# Patient Record
Sex: Male | Born: 1950
Health system: Southern US, Community
[De-identification: ages and names within clinical notes are randomized; demographics above are authoritative.]

## PROBLEM LIST (undated history)

## (undated) DIAGNOSIS — E119 Type 2 diabetes mellitus without complications: Secondary | ICD-10-CM

## (undated) DIAGNOSIS — I10 Essential (primary) hypertension: Secondary | ICD-10-CM

## (undated) DIAGNOSIS — E785 Hyperlipidemia, unspecified: Secondary | ICD-10-CM

## (undated) HISTORY — DX: Hyperlipidemia, unspecified: E78.5

---

## 2005-07-29 ENCOUNTER — Emergency Department (HOSPITAL_COMMUNITY): Admission: EM | Admit: 2005-07-29 | Discharge: 2005-07-29 | Payer: Self-pay | Admitting: Emergency Medicine

## 2009-03-18 ENCOUNTER — Encounter (INDEPENDENT_AMBULATORY_CARE_PROVIDER_SITE_OTHER): Payer: Self-pay | Admitting: *Deleted

## 2010-04-13 ENCOUNTER — Emergency Department (HOSPITAL_COMMUNITY): Admission: EM | Admit: 2010-04-13 | Discharge: 2010-04-13 | Payer: Self-pay | Admitting: Emergency Medicine

## 2010-04-27 ENCOUNTER — Ambulatory Visit: Payer: Self-pay | Admitting: Cardiology

## 2010-04-27 ENCOUNTER — Inpatient Hospital Stay (HOSPITAL_COMMUNITY): Admission: EM | Admit: 2010-04-27 | Discharge: 2010-04-28 | Payer: Self-pay | Admitting: Emergency Medicine

## 2010-11-27 LAB — CBC
HCT: 36.5 % — ABNORMAL LOW (ref 39.0–52.0)
HCT: 38.6 % — ABNORMAL LOW (ref 39.0–52.0)
Hemoglobin: 13 g/dL (ref 13.0–17.0)
Hemoglobin: 13.3 g/dL (ref 13.0–17.0)
Hemoglobin: 14.6 g/dL (ref 13.0–17.0)
MCH: 30.4 pg (ref 26.0–34.0)
MCH: 30.8 pg (ref 26.0–34.0)
MCH: 31.4 pg (ref 26.0–34.0)
MCHC: 34.5 g/dL (ref 30.0–36.0)
MCHC: 34.5 g/dL (ref 30.0–36.0)
MCHC: 35.5 g/dL (ref 30.0–36.0)
MCV: 88.6 fL (ref 78.0–100.0)
MCV: 89.1 fL (ref 78.0–100.0)
Platelets: 217 10*3/uL (ref 150–400)
Platelets: 240 10*3/uL (ref 150–400)
Platelets: 261 10*3/uL (ref 150–400)
RBC: 4.12 MIL/uL — ABNORMAL LOW (ref 4.22–5.81)
RBC: 4.34 MIL/uL (ref 4.22–5.81)
RBC: 4.79 MIL/uL (ref 4.22–5.81)
RDW: 12.8 % (ref 11.5–15.5)
RDW: 13.3 % (ref 11.5–15.5)
WBC: 5.9 10*3/uL (ref 4.0–10.5)
WBC: 6.5 10*3/uL (ref 4.0–10.5)

## 2010-11-27 LAB — CARDIAC PANEL(CRET KIN+CKTOT+MB+TROPI)
CK, MB: 2 ng/mL (ref 0.3–4.0)
CK, MB: 2.1 ng/mL (ref 0.3–4.0)
CK, MB: 2.2 ng/mL (ref 0.3–4.0)
CK, MB: 2.4 ng/mL (ref 0.3–4.0)
Relative Index: INVALID (ref 0.0–2.5)
Relative Index: INVALID (ref 0.0–2.5)
Relative Index: INVALID (ref 0.0–2.5)
Total CK: 56 U/L (ref 7–232)
Total CK: 58 U/L (ref 7–232)
Total CK: 63 U/L (ref 7–232)
Total CK: 64 U/L (ref 7–232)
Troponin I: 0.01 ng/mL (ref 0.00–0.06)
Troponin I: 0.01 ng/mL (ref 0.00–0.06)
Troponin I: 0.02 ng/mL (ref 0.00–0.06)

## 2010-11-27 LAB — BASIC METABOLIC PANEL
BUN: 23 mg/dL (ref 6–23)
CO2: 24 mEq/L (ref 19–32)
CO2: 25 mEq/L (ref 19–32)
Calcium: 8.7 mg/dL (ref 8.4–10.5)
Calcium: 8.9 mg/dL (ref 8.4–10.5)
Chloride: 103 mEq/L (ref 96–112)
Creatinine, Ser: 1.43 mg/dL (ref 0.4–1.5)
Creatinine, Ser: 1.44 mg/dL (ref 0.4–1.5)
GFR calc Af Amer: >60 mL/min (ref >60)
GFR calc Af Amer: >60 mL/min (ref >60)
GFR calc non Af Amer: 51 mL/min — ABNORMAL LOW (ref >60)
GFR calc non Af Amer: 51 mL/min — ABNORMAL LOW (ref >60)
Glucose, Bld: 244 mg/dL — ABNORMAL HIGH (ref 70–99)
Glucose, Bld: 487 mg/dL — ABNORMAL HIGH (ref 70–99)
Potassium: 4.6 mEq/L (ref 3.5–5.1)
Sodium: 127 mEq/L — ABNORMAL LOW (ref 135–145)
Sodium: 133 mEq/L — ABNORMAL LOW (ref 135–145)

## 2010-11-27 LAB — DIFFERENTIAL
Basophils Absolute: 0 10*3/uL (ref 0.0–0.1)
Basophils Relative: 0 % (ref 0–1)
Basophils Relative: 0 % (ref 0–1)
Eosinophils Absolute: 0.1 10*3/uL (ref 0.0–0.7)
Eosinophils Absolute: 0.1 10*3/uL (ref 0.0–0.7)
Eosinophils Absolute: 0.2 10*3/uL (ref 0.0–0.7)
Eosinophils Relative: 2 % (ref 0–5)
Eosinophils Relative: 3 % (ref 0–5)
Lymphocytes Relative: 29 % (ref 12–46)
Lymphocytes Relative: 43 % (ref 12–46)
Lymphs Abs: 1.9 10*3/uL (ref 0.7–4.0)
Lymphs Abs: 2.5 10*3/uL (ref 0.7–4.0)
Monocytes Absolute: 0.6 10*3/uL (ref 0.1–1.0)
Monocytes Relative: 8 % (ref 3–12)
Monocytes Relative: 9 % (ref 3–12)
Neutro Abs: 3.9 10*3/uL (ref 1.7–7.7)
Neutro Abs: 3.9 10*3/uL (ref 1.7–7.7)
Neutrophils Relative %: 60 % (ref 43–77)
Neutrophils Relative %: 63 % (ref 43–77)

## 2010-11-27 LAB — CK: Total CK: 65 U/L (ref 7–232)

## 2010-11-27 LAB — URINALYSIS, ROUTINE W REFLEX MICROSCOPIC
Leukocytes, UA: NEGATIVE
Nitrite: NEGATIVE
Protein, ur: NEGATIVE mg/dL
Specific Gravity, Urine: 1.015 (ref 1.005–1.030)
Urobilinogen, UA: 0.2 mg/dL (ref 0.0–1.0)

## 2010-11-27 LAB — GLUCOSE, CAPILLARY
Glucose-Capillary: 176 mg/dL — ABNORMAL HIGH (ref 70–99)
Glucose-Capillary: 196 mg/dL — ABNORMAL HIGH (ref 70–99)
Glucose-Capillary: 210 mg/dL — ABNORMAL HIGH (ref 70–99)
Glucose-Capillary: 242 mg/dL — ABNORMAL HIGH (ref 70–99)
Glucose-Capillary: 249 mg/dL — ABNORMAL HIGH (ref 70–99)
Glucose-Capillary: 260 mg/dL — ABNORMAL HIGH (ref 70–99)

## 2010-11-27 LAB — COMPREHENSIVE METABOLIC PANEL
ALT: 16 U/L (ref 0–53)
AST: 14 U/L (ref 0–37)
Albumin: 3.7 g/dL (ref 3.5–5.2)
Alkaline Phosphatase: 44 U/L (ref 39–117)
BUN: 12 mg/dL (ref 6–23)
CO2: 25 mEq/L (ref 19–32)
Calcium: 8.9 mg/dL (ref 8.4–10.5)
Chloride: 107 mEq/L (ref 96–112)
Creatinine, Ser: 1.01 mg/dL (ref 0.4–1.5)
GFR calc Af Amer: >60 mL/min (ref >60)
GFR calc non Af Amer: >60 mL/min (ref >60)
Glucose, Bld: 175 mg/dL — ABNORMAL HIGH (ref 70–99)
Potassium: 3.7 mEq/L (ref 3.5–5.1)
Sodium: 139 mEq/L (ref 135–145)
Total Bilirubin: 0.6 mg/dL (ref 0.3–1.2)
Total Protein: 6.9 g/dL (ref 6.0–8.3)

## 2010-11-27 LAB — URINE MICROSCOPIC-ADD ON

## 2010-11-27 LAB — POCT CARDIAC MARKERS
CKMB, poc: 1.2 ng/mL (ref 1.0–8.0)
CKMB, poc: 1.4 ng/mL (ref 1.0–8.0)
Myoglobin, poc: 85.3 ng/mL (ref 12–200)
Myoglobin, poc: 90.6 ng/mL (ref 12–200)
Troponin i, poc: <0.05 ng/mL (ref 0.00–0.09)
Troponin i, poc: <0.05 ng/mL (ref 0.00–0.09)

## 2010-11-27 LAB — HEMOGLOBIN A1C
Hgb A1c MFr Bld: 12.5 % — ABNORMAL HIGH (ref <5.7)
Mean Plasma Glucose: 312 mg/dL — ABNORMAL HIGH (ref <117)

## 2010-11-27 LAB — TSH: TSH: 0.957 u[IU]/mL (ref 0.350–4.500)

## 2010-11-27 LAB — T4, FREE: Free T4: 1.23 ng/dL (ref 0.80–1.80)

## 2010-11-27 LAB — TROPONIN I: Troponin I: 0.02 ng/mL (ref 0.00–0.06)

## 2010-11-27 LAB — D-DIMER, QUANTITATIVE: D-Dimer, Quant: 0.47 ug/mL-FEU (ref 0.00–0.48)

## 2011-07-12 IMAGING — US US CAROTID DUPLEX BILAT
1 series · 13 of 24 positions shown · non-contrast
Comparison: None

CLINICAL DATA: Near-syncope, hypertension, smoking history

BILATERAL CAROTID DUPLEX ULTRASOUND
TECHNIQUE: Gray scale imaging, color Doppler and duplex ultrasound
was performed of bilateral carotid and vertebral arteries in the
neck.

[Series 1: us carotid duplex bilat · 0.07mm/px · 13 of 67 slices shown]
[im 1/67]
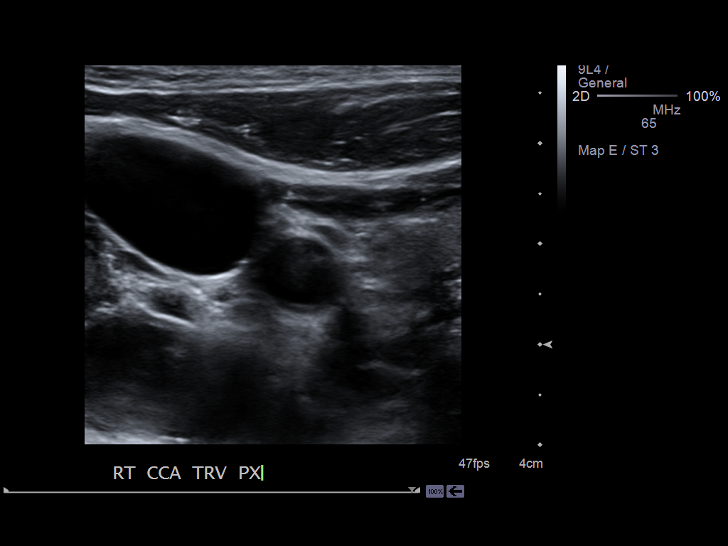
[im 6/67]
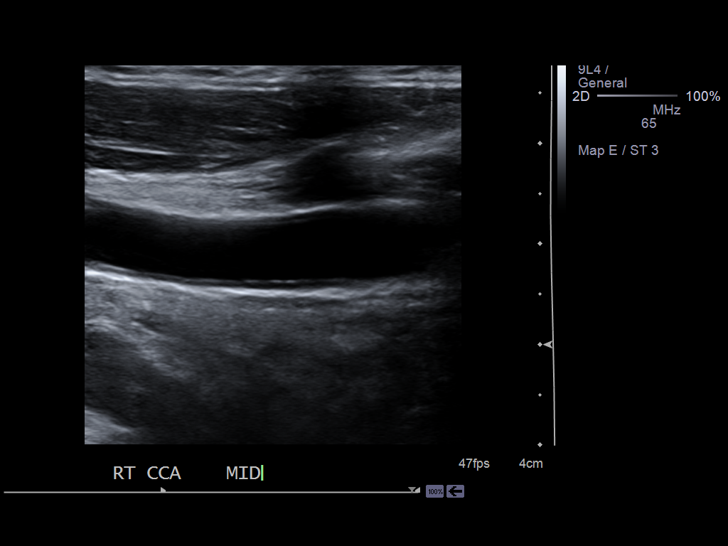
[im 12/67]
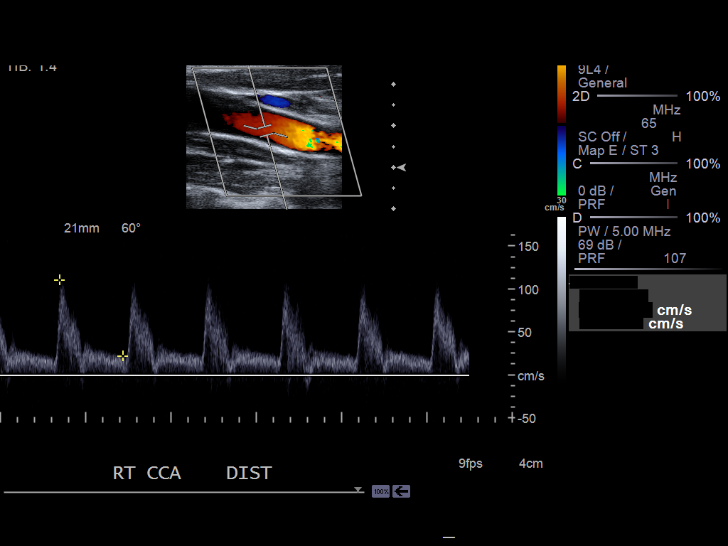
[im 18/67]
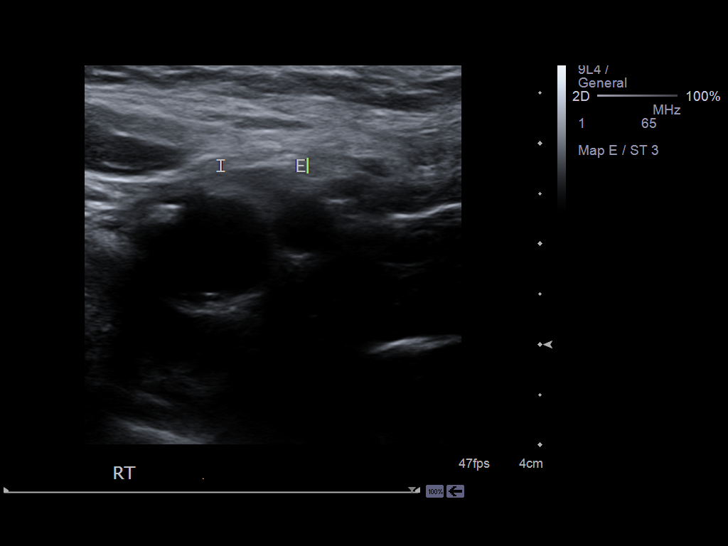
[im 23/67]
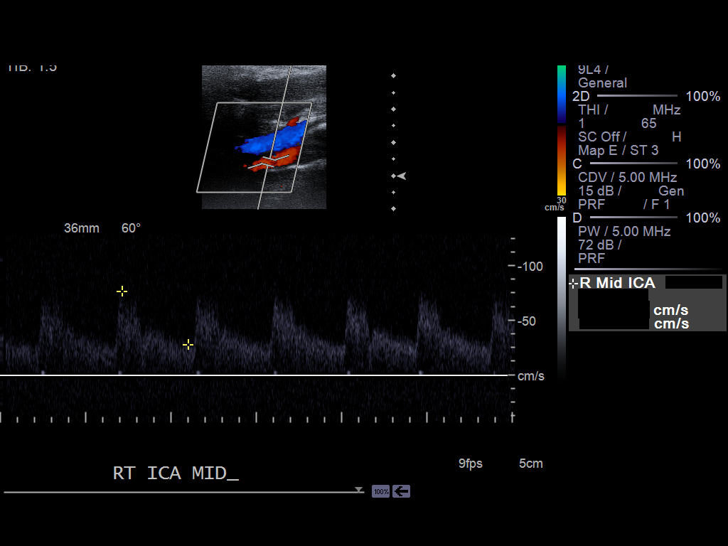
[im 29/67]
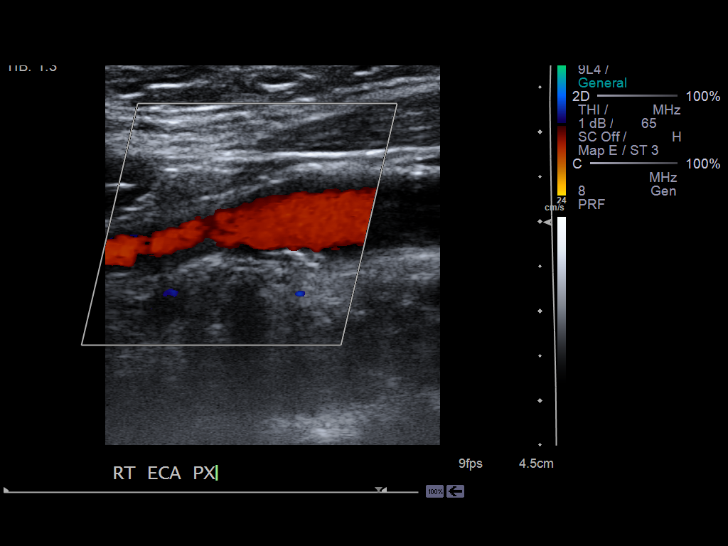
[im 35/67]
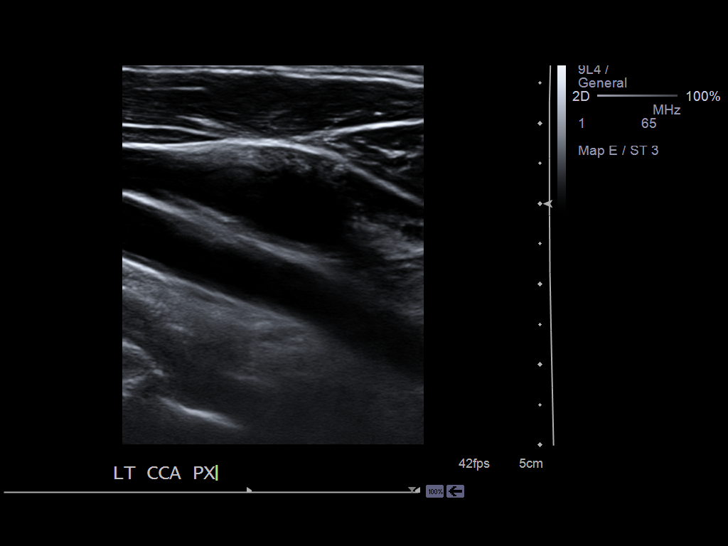
[im 38/67]
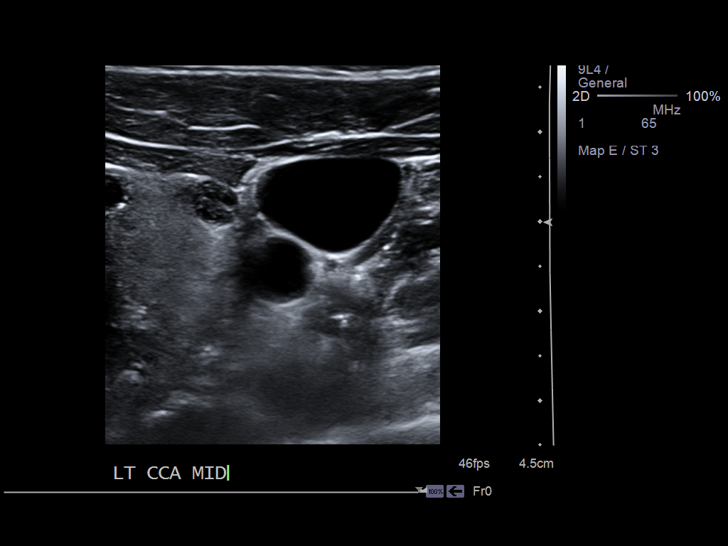
[im 44/67]
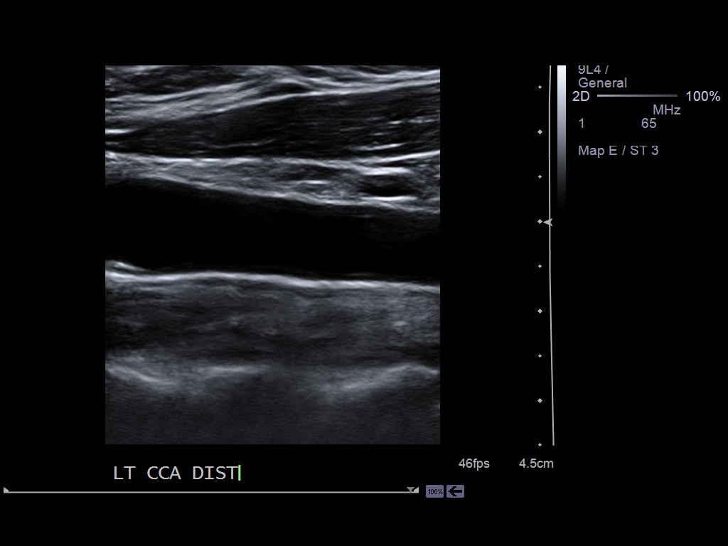
[im 49/67]
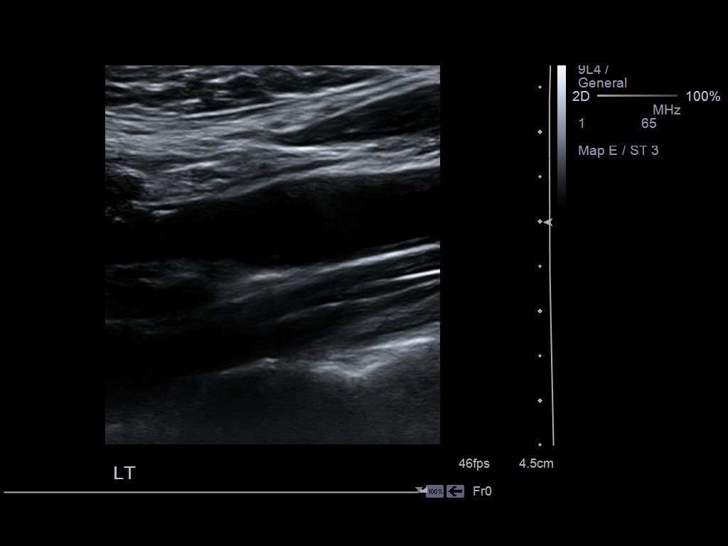
[im 55/67]
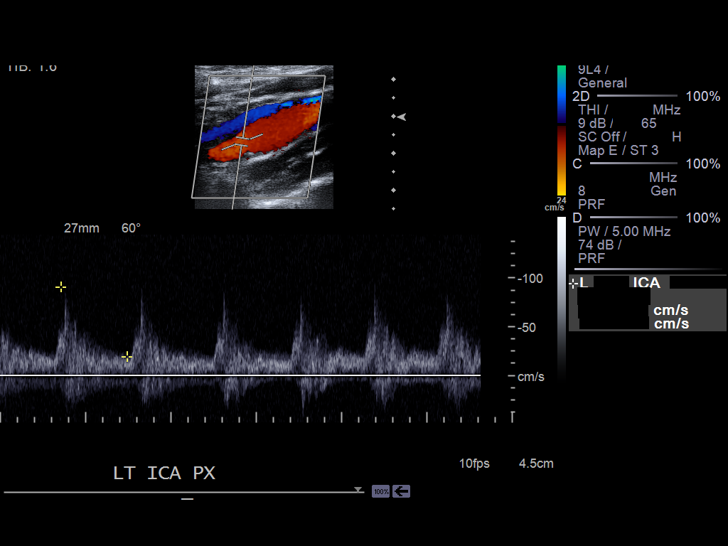
[im 61/67]
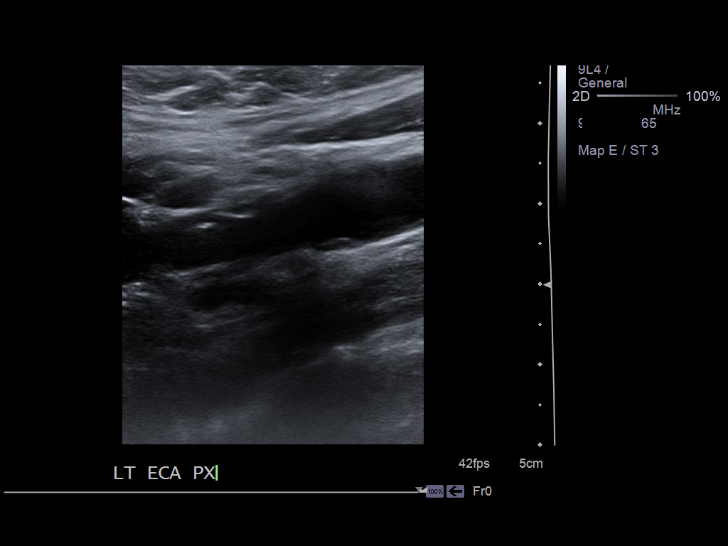
[im 67/67]
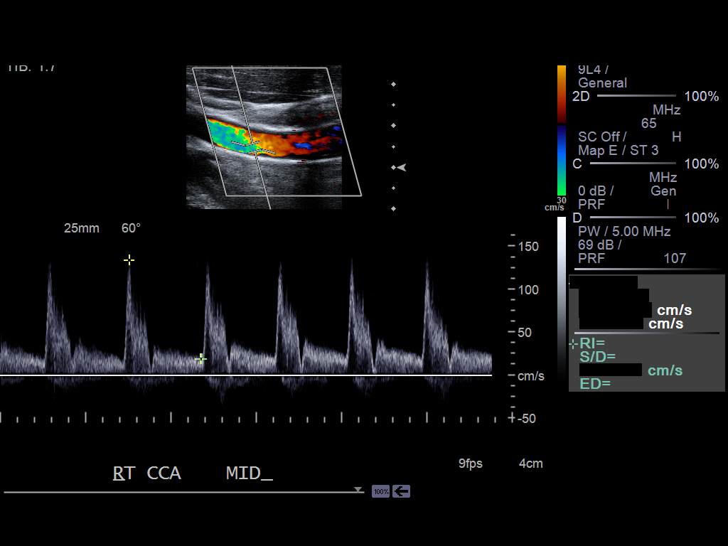

[13 of 24 positions shown; findings below may reference images not displayed]

Criteria:  Quantification of carotid stenosis is based on velocity
parameters that correlate the residual internal carotid diameter
with NASCET-based stenosis levels, using the diameter of the distal
internal carotid lumen as the denominator for stenosis measurement.

The following velocity measurements were obtained:

                 PEAK SYSTOLIC/END DIASTOLIC
RIGHT
ICA:                        79/22cm/sec
CCA:                        135/20cm/sec
SYSTOLIC ICA/CCA RATIO:
DIASTOLIC ICA/CCA RATIO:
ECA:                        121/13cm/sec

LEFT
ICA:                        91/20cm/sec
CCA:                        175/26cm/sec
SYSTOLIC ICA/CCA RATIO:
DIASTOLIC ICA/CCA RATIO:
ECA:                        135/16cm/sec
FINDINGS: RIGHT CAROTID ARTERY: Mild intimal thickening right CCA.  Minimally
turbulent flow at areas of mild tortuosity.  Minimal plaque
formation right carotid bulb with associated turbulent flow.
Additional plaque formation proximal right ECA.  Spectral
broadening proximal right ICA on waveform analysis likely related
to turbulence at carotid bulb.  No high velocity jets.

RIGHT VERTEBRAL ARTERY:  Patent, antegrade

LEFT CAROTID ARTERY: Intimal thickening left CCA.  Tiny echogenic
plaque left carotid bulb.  Tiny noncalcified plaque proximal left
ICA. Turbulent flow proximal left ICA, presumed due to tortuosity
since the no significant plaque is seen.  Minimal plaque also noted
the proximal left ECA.  Spectral broadening left ICA on waveform
analysis.  No high velocity jets.

LEFT VERTEBRAL ARTERY:  Patent, antegrade
IMPRESSION: Minimal plaque at carotid bifurcations.  No evidence of
hemodynamically significant stenosis within the visualized common
carotid or internal carotid arteries.

Elevated peak systolic velocities are noted in the common carotid
arteries bilaterally.  This can be seen with hyperdynamic states
and with proximal stenoses at the origins of the common carotid
arteries or at aortic arch level.  Consider MRA imaging of neck,
which includes assessment to the aortic arch, in order to exclude
proximal common carotid arterial stenoses.

## 2014-07-23 ENCOUNTER — Encounter: Payer: Self-pay | Admitting: Orthopedic Surgery

## 2014-07-23 ENCOUNTER — Ambulatory Visit: Payer: Self-pay | Admitting: Orthopedic Surgery

## 2015-04-30 ENCOUNTER — Ambulatory Visit (HOSPITAL_COMMUNITY)
Admission: RE | Admit: 2015-04-30 | Discharge: 2015-04-30 | Disposition: A | Discharge status: Home / Self Care | Payer: BLUE CROSS/BLUE SHIELD | Source: Ambulatory Visit | Attending: Family Medicine | Admitting: Family Medicine

## 2015-04-30 ENCOUNTER — Other Ambulatory Visit (HOSPITAL_COMMUNITY): Payer: Self-pay | Admitting: Family Medicine

## 2015-04-30 DIAGNOSIS — M25561 Pain in right knee: Secondary | ICD-10-CM

## 2015-06-03 ENCOUNTER — Ambulatory Visit: Payer: BLUE CROSS/BLUE SHIELD | Admitting: Orthopedic Surgery

## 2015-06-03 ENCOUNTER — Other Ambulatory Visit: Payer: Self-pay | Admitting: Orthopedic Surgery

## 2015-06-03 DIAGNOSIS — M25561 Pain in right knee: Secondary | ICD-10-CM

## 2015-06-14 ENCOUNTER — Other Ambulatory Visit: Payer: BLUE CROSS/BLUE SHIELD

## 2015-06-28 ENCOUNTER — Other Ambulatory Visit: Payer: BLUE CROSS/BLUE SHIELD

## 2015-12-31 ENCOUNTER — Ambulatory Visit (INDEPENDENT_AMBULATORY_CARE_PROVIDER_SITE_OTHER): Payer: BLUE CROSS/BLUE SHIELD | Admitting: Orthopedic Surgery

## 2015-12-31 ENCOUNTER — Encounter: Payer: Self-pay | Admitting: Orthopedic Surgery

## 2015-12-31 VITALS — BP 176/69 | Ht 74.0 in | Wt 227.0 lb

## 2015-12-31 DIAGNOSIS — S83241A Other tear of medial meniscus, current injury, right knee, initial encounter: Secondary | ICD-10-CM | POA: Diagnosis not present

## 2015-12-31 DIAGNOSIS — M25461 Effusion, right knee: Secondary | ICD-10-CM | POA: Diagnosis not present

## 2015-12-31 NOTE — Patient Instructions (Signed)
Ice 3 x a day   Take ibuprofen 2 x a day

## 2016-01-01 NOTE — Progress Notes (Signed)
Patient ID: Norman Campbell, male   DOB: 1951/04/29, 65 y.o.   MRN: [id number]  No chief complaint on file.   HPI Norman Campbell is a 65 y.o. male. Presents for evaluation of his right knee pain 1 year  Again pain right knee Swelling Aching No trauma Worse with standing improved with heat 6 out of 10 pain intensity Prior x-ray was done.   Review of Systems Review of systems diet review of systems swollen joints and joint pain from musculoskeletal constitutional denied weight loss fever chills neuro denied numbness or tingling    The patient reports he has diabetes and hypertension  The patient reports that he has not had any surgery  Social History Social History  Substance Use Topics  . Smoking status: Current Every Day Smoker  . Smokeless tobacco: None  . Alcohol Use: None    No Known Allergies  Current Outpatient Prescriptions  Medication Sig Dispense Refill  . aspirin 81 MG tablet 81 mg.    . lisinopril (PRINIVIL,ZESTRIL) 20 MG tablet 20 mg.    . meclizine (ANTIVERT) 25 MG tablet 25 mg.    . metFORMIN (GLUCOPHAGE) 1000 MG tablet 1,000 mg.     No current facility-administered medications for this visit.    Physical Exam  BP 176/69 mmHg  Ht 6\' 2"  (1.88 m)  Wt 227 lb (102.967 kg)  BMI 29.13 kg/m2  Physical Exam  Constitutional: He is oriented to person, place, and time. He appears well-developed and well-nourished. No distress.  Cardiovascular: Normal rate and intact distal pulses.   Neurological: He is alert and oriented to person, place, and time.  Skin: Skin is warm and dry. No rash noted. He is not diaphoretic. No erythema. No pallor.  Psychiatric: He has a normal mood and affect. His behavior is normal. Judgment and thought content normal.    Ortho Exam  Gait: Slight limp  Right knee medial joint line tenderness right knee large effusion right knee ligament stable strength normal right knee limited range of motion 90  Left knee full range  of motion normal stability and strength skin normal no tenderness or swelling  Data Reviewed I think his x-ray shows very good looking knee for his age and the amount of swelling and limited motion  Assessment  Joint effusion probable meniscal tear   Plan  Aspirate inject knee continue ibuprofen follow-up in 2-3 weeks  Procedure note injection and aspiration right knee joint  Verbal consent was obtained to aspirate and inject the right knee joint   Timeout was completed to confirm the site of aspiration and injection  An 18-gauge needle was used to aspirate the knee joint from a suprapatellar lateral approach.  The medications used were 40 mg of Depo-Medrol and 1% lidocaine 3 cc  Anesthesia was provided by ethyl chloride and the skin was prepped with alcohol.  After cleaning the skin with alcohol an 18-gauge needle was used to aspirate the right knee joint.  We obtained 40  cc of fluid  We follow this by injection of 40 mg of Depo-Medrol and 3 cc 1% lidocaine.  There were no complications. A sterile bandage was applied.

## 2016-01-14 ENCOUNTER — Ambulatory Visit: Payer: BLUE CROSS/BLUE SHIELD | Admitting: Orthopedic Surgery

## 2016-01-21 ENCOUNTER — Ambulatory Visit: Payer: BLUE CROSS/BLUE SHIELD | Admitting: Orthopedic Surgery

## 2016-02-11 ENCOUNTER — Ambulatory Visit (HOSPITAL_COMMUNITY)
Admission: RE | Admit: 2016-02-11 | Discharge: 2016-02-11 | Disposition: A | Discharge status: Home / Self Care | Payer: BLUE CROSS/BLUE SHIELD | Source: Ambulatory Visit | Attending: Family Medicine | Admitting: Family Medicine

## 2016-02-11 DIAGNOSIS — R002 Palpitations: Secondary | ICD-10-CM | POA: Diagnosis not present

## 2016-02-11 DIAGNOSIS — R42 Dizziness and giddiness: Secondary | ICD-10-CM | POA: Diagnosis not present

## 2019-09-26 ENCOUNTER — Ambulatory Visit: Payer: Self-pay | Attending: Internal Medicine

## 2019-09-26 ENCOUNTER — Other Ambulatory Visit: Payer: Self-pay

## 2019-09-26 DIAGNOSIS — Z20822 Contact with and (suspected) exposure to covid-19: Secondary | ICD-10-CM | POA: Insufficient documentation

## 2019-09-28 ENCOUNTER — Telehealth: Payer: Self-pay

## 2019-09-28 LAB — NOVEL CORONAVIRUS, NAA: SARS-CoV-2, NAA: NOT DETECTED

## 2019-09-28 NOTE — Telephone Encounter (Signed)
Pt notified of negative COVID-19 results. Understanding verbalized.  Norman Campbell   

## 2019-10-03 ENCOUNTER — Other Ambulatory Visit: Payer: Self-pay

## 2019-10-03 ENCOUNTER — Ambulatory Visit: Payer: Self-pay | Attending: Internal Medicine

## 2019-10-03 DIAGNOSIS — Z20822 Contact with and (suspected) exposure to covid-19: Secondary | ICD-10-CM | POA: Insufficient documentation

## 2019-10-04 LAB — NOVEL CORONAVIRUS, NAA: SARS-CoV-2, NAA: NOT DETECTED

## 2019-11-13 ENCOUNTER — Ambulatory Visit: Payer: Self-pay | Attending: Internal Medicine

## 2019-11-13 DIAGNOSIS — Z23 Encounter for immunization: Secondary | ICD-10-CM | POA: Insufficient documentation

## 2019-11-13 NOTE — Progress Notes (Signed)
   Covid-19 Vaccination Clinic  Name:  Norman Campbell    MRN: [id number] DOB: 06-02-1951  11/13/2019  Norman Campbell was observed post Covid-19 immunization for 15 minutes without incident. He was provided with Vaccine Information Sheet and instruction to access the V-Safe system.   Norman Campbell was instructed to call 911 with any severe reactions post vaccine: Marland Kitchen Difficulty breathing  . Swelling of face and throat  . A fast heartbeat  . A bad rash all over body  . Dizziness and weakness   Immunizations Administered    Name Date Dose VIS Date Route   Moderna COVID-19 Vaccine 11/13/2019  1:25 PM 0.5 mL 08/14/2019 Intramuscular   Manufacturer: Moderna   Lot: [id number]   NDC: 74944-967-59

## 2019-12-11 ENCOUNTER — Ambulatory Visit: Payer: Self-pay | Attending: Internal Medicine

## 2019-12-11 DIAGNOSIS — Z23 Encounter for immunization: Secondary | ICD-10-CM

## 2019-12-11 NOTE — Progress Notes (Signed)
   Covid-19 Vaccination Clinic  Name:  Norman Campbell    MRN: [id number] DOB: [birth year]  12/11/2019  [name] was observed post Covid-19 immunization for 15 minutes without incident. He was provided with Vaccine Information Sheet and instruction to access the V-Safe system.   [name] was instructed to call 911 with any severe reactions post vaccine: Marland Kitchen Difficulty breathing  . Swelling of face and throat  . A fast heartbeat  . A bad rash all over body  . Dizziness and weakness   Immunizations Administered    Name Date Dose VIS Date Route   Moderna COVID-19 Vaccine 12/11/2019 12:45 PM 0.5 mL 08/14/2019 Intramuscular   Manufacturer: Moderna   Lot: [id number]   NDC: 01007-121-97

## 2020-11-03 ENCOUNTER — Encounter (HOSPITAL_COMMUNITY): Payer: Self-pay

## 2020-11-03 ENCOUNTER — Emergency Department (HOSPITAL_COMMUNITY): Payer: Medicare HMO

## 2020-11-03 ENCOUNTER — Other Ambulatory Visit: Payer: Self-pay

## 2020-11-03 ENCOUNTER — Emergency Department (HOSPITAL_COMMUNITY)
Admission: EM | Admit: 2020-11-03 | Discharge: 2020-11-03 | Disposition: A | Discharge status: Home / Self Care | Payer: Medicare HMO | Attending: Emergency Medicine | Admitting: Emergency Medicine

## 2020-11-03 DIAGNOSIS — Z7982 Long term (current) use of aspirin: Secondary | ICD-10-CM | POA: Diagnosis not present

## 2020-11-03 DIAGNOSIS — I10 Essential (primary) hypertension: Secondary | ICD-10-CM | POA: Insufficient documentation

## 2020-11-03 DIAGNOSIS — Z79899 Other long term (current) drug therapy: Secondary | ICD-10-CM | POA: Insufficient documentation

## 2020-11-03 DIAGNOSIS — F17210 Nicotine dependence, cigarettes, uncomplicated: Secondary | ICD-10-CM | POA: Insufficient documentation

## 2020-11-03 DIAGNOSIS — Z7984 Long term (current) use of oral hypoglycemic drugs: Secondary | ICD-10-CM | POA: Insufficient documentation

## 2020-11-03 DIAGNOSIS — E119 Type 2 diabetes mellitus without complications: Secondary | ICD-10-CM | POA: Insufficient documentation

## 2020-11-03 DIAGNOSIS — M25531 Pain in right wrist: Secondary | ICD-10-CM | POA: Diagnosis present

## 2020-11-03 DIAGNOSIS — W19XXXA Unspecified fall, initial encounter: Secondary | ICD-10-CM | POA: Diagnosis not present

## 2020-11-03 HISTORY — DX: Type 2 diabetes mellitus without complications: E11.9

## 2020-11-03 HISTORY — DX: Essential (primary) hypertension: I10

## 2020-11-03 NOTE — ED Provider Notes (Signed)
[facility] EMERGENCY DEPARTMENT Provider Note   CSN: [id number] Arrival date & time: 11/03/20  1037     History Chief Complaint  Patient presents with  . Wrist Pain    Norman Campbell is a 70 y.o. male.  The history is provided by the patient. No language interpreter was used.  Wrist Pain This is a new problem. The problem occurs constantly. The problem has been gradually worsening. Nothing aggravates the symptoms. Nothing relieves the symptoms. He has tried nothing for the symptoms. The treatment provided no relief.   Pt reports he injured his wrist in January.  Pt reports his wrist is still hurts. Pt reports wrist continues to be swollen     Past Medical History:  Diagnosis Date  . Diabetes mellitus without complication (HCC)   . Hypertension     There are no problems to display for this patient.   History reviewed. No pertinent surgical history.     No family history on file.  Social History   Tobacco Use  . Smoking status: Current Every Day Smoker    Types: Cigarettes  . Smokeless tobacco: Never Used  Substance Use Topics  . Alcohol use: Not Currently    Alcohol/week: 0.0 standard drinks  . Drug use: Never    Home Medications Prior to Admission medications   Medication Sig Start Date End Date Taking? Authorizing Provider  aspirin 81 MG tablet 81 mg.    [provider]  lisinopril (PRINIVIL,ZESTRIL) 20 MG tablet 20 mg.    [provider]  meclizine (ANTIVERT) 25 MG tablet 25 mg.    [provider]  metFORMIN (GLUCOPHAGE) 1000 MG tablet 1,000 mg.    [provider]    Allergies    Patient has no known allergies.  Review of Systems   Review of Systems  All other systems reviewed and are negative.   Physical Exam Updated Vital Signs BP (!) 178/88 (BP Location: Left Arm)   Pulse 84   Temp 98 F (36.7 C) (Oral)   Resp 18   Ht 6\' 2"  (1.88 m)   Wt 98.9 kg   SpO2 98%   BMI 27.99 kg/m   Physical  Exam Vitals and nursing note reviewed.  Constitutional:      Appearance: He is well-developed and well-nourished.  HENT:     Head: Normocephalic and atraumatic.  Eyes:     Conjunctiva/sclera: Conjunctivae normal.  Musculoskeletal:        General: Swelling and tenderness present. No edema.     Cervical back: Neck supple.     Comments: Tender swollen wrist  nv and ns intact   Skin:    General: Skin is warm and dry.  Neurological:     General: No focal deficit present.     Mental Status: He is alert.  Psychiatric:        Mood and Affect: Mood and affect and mood normal.     ED Results / Procedures / Treatments   Labs (all labs ordered are listed, but only abnormal results are displayed) Labs Reviewed - No data to display  EKG None  Radiology DG Wrist Complete Right  Result Date: 11/03/2020 CLINICAL DATA:  Pain in the right hand and wrist after fall in January EXAM: RIGHT WRIST - COMPLETE 3+ VIEW COMPARISON:  None. FINDINGS: Dorsal soft tissue swelling at the level of the carpus. Tiny fragment adjacent to the proximal and medial sided lunate. Mild calcification seen in the region of the radial  artery. IMPRESSION: Tiny flake of bone near the proximal lunate which is age indeterminate, possible recent avulsion fracture in this setting. Electronically Signed   By: Marnee Spring M.D.   On: 11/03/2020 11:39   DG Hand Complete Right  Result Date: 11/03/2020 CLINICAL DATA:  Hand and wrist pain after fall in January. EXAM: RIGHT HAND - COMPLETE 3+ VIEW COMPARISON:  None. FINDINGS: Soft tissue swelling dorsal to the carpus. No visible fracture or subluxation. No opaque foreign body. IMPRESSION: Negative for fracture or malalignment. Electronically Signed   By: Marnee Spring M.D.   On: 11/03/2020 11:38    Procedures Procedures   Medications Ordered in ED Medications - No data to display  ED Course  I have reviewed the triage vital signs and the nursing notes.  Pertinent labs &  imaging results that were available during my care of the patient were reviewed by me and considered in my medical decision making (see chart for details).    MDM Rules/Calculators/A&P                          MDM:  Herby Abraham shows possible fracture.  Pt placed in a splint.  Pt advised to follow up with Hand surgeon for evaluation  Final Clinical Impression(s) / ED Diagnoses Final diagnoses:  Right wrist pain    Rx / DC Orders An After Visit Summary was printed and given to the patient.    Elson Areas, Cordelia Poche 11/03/20 1508    Maia Plan, MD 11/04/20 [phone]

## 2020-11-03 NOTE — ED Triage Notes (Signed)
Pt presents to ED with complaints of right wrist and hand pain since fall in January.

## 2020-11-10 ENCOUNTER — Telehealth: Payer: Self-pay | Admitting: Orthopedic Surgery

## 2020-11-17 ENCOUNTER — Encounter: Payer: Self-pay | Admitting: Orthopedic Surgery

## 2020-11-17 ENCOUNTER — Other Ambulatory Visit: Payer: Self-pay

## 2020-11-17 ENCOUNTER — Ambulatory Visit (INDEPENDENT_AMBULATORY_CARE_PROVIDER_SITE_OTHER): Payer: Medicare HMO | Admitting: Orthopedic Surgery

## 2020-11-17 VITALS — BP 173/101 | HR 98 | Ht 74.0 in | Wt 208.0 lb

## 2020-11-17 DIAGNOSIS — W009XXA Unspecified fall due to ice and snow, initial encounter: Secondary | ICD-10-CM | POA: Diagnosis not present

## 2020-11-17 DIAGNOSIS — M25531 Pain in right wrist: Secondary | ICD-10-CM

## 2020-11-17 MED ORDER — TRAMADOL-ACETAMINOPHEN 37.5-325 MG PO TABS: 1.0000 | ORAL_TABLET | ORAL | 5 refills | Status: DC | PRN

## 2020-11-17 NOTE — Progress Notes (Signed)
Chief Complaint  Patient presents with  . Wrist Pain    Right fall on ice several weeks ago, pain since      70 year old male fell on his right wrist several weeks ago thought it would get better on its own it did not.  He presented to the ER for x-rays he is shown to have a probable small avulsion fracture from the lunate.  No prior history of wrist problems  Is been in a wrist splint for 2 weeks with improvement though still discomfort.  He is working as a Nature conservation officer at [employer] and is concerned about his employment  Review of systems light sensitivity no fever chills shortness of breath numbness or tingling  Past Medical History:  Diagnosis Date  . Diabetes mellitus without complication (HCC)   . Hypertension    Physical Exam Constitutional:      General: He is not in acute distress.    Appearance: He is well-developed.     Comments: Well developed, well nourished Normal grooming and hygiene     Cardiovascular:     Comments: No peripheral edema Musculoskeletal:     Comments: Right wrist is swollen compared to the left  He has painful range of motion flexion extension.  Tenderness over the wrist joint near the lunate.  Neurovascular status intact skin is intact grip strength is normal  Skin:    General: Skin is warm and dry.  Neurological:     Mental Status: He is alert and oriented to person, place, and time.     Sensory: No sensory deficit.     Coordination: Coordination normal.     Gait: Gait normal.     Deep Tendon Reflexes: Reflexes are normal and symmetric.  Psychiatric:        Mood and Affect: Mood normal.        Behavior: Behavior normal.        Thought Content: Thought content normal.        Judgment: Judgment normal.     Comments: Affect normal      Data review X-rays dated October 03, 2020 from the emergency room I am reading those x-rays as possible fracture lunate slight narrowing of the radial scaphoid interval no osteophytes alignment  of the wrist is normal  3 views hand dated November 03, 2020 also similar findings data  ER records confirm patient's fall and splinting  Assessment and plan  Acute wrist pain possible fracture Encounter Diagnosis  Name Primary?  . Acute wrist pain, right Yes   Meds ordered this encounter  Medications  . traMADol-acetaminophen (ULTRACET) 37.5-325 MG tablet    Sig: Take 1 tablet by mouth every 4 (four) hours as needed.    Dispense:  90 tablet    Refill:  5      Recommend splinting for 6 weeks.  Continue ibuprofen add Ultracet patient says ibuprofen not working in terms of pain control

## 2020-11-17 NOTE — Patient Instructions (Signed)
WEAR BRACE 4 MORE WEEK S  CONTINUE IBUPROFEN FOR THE SWELLING AND START ULTRACET FOR PAIN   OOW X 4 WEEKS

## 2020-11-18 NOTE — Telephone Encounter (Signed)
error 

## 2020-12-15 ENCOUNTER — Other Ambulatory Visit: Payer: Self-pay

## 2020-12-15 ENCOUNTER — Encounter: Payer: Self-pay | Admitting: Orthopedic Surgery

## 2020-12-15 ENCOUNTER — Ambulatory Visit: Payer: Medicare HMO | Admitting: Orthopedic Surgery

## 2020-12-15 VITALS — BP 177/100 | HR 102 | Ht 74.0 in | Wt 208.0 lb

## 2020-12-15 DIAGNOSIS — M25531 Pain in right wrist: Secondary | ICD-10-CM | POA: Diagnosis not present

## 2020-12-15 DIAGNOSIS — G8929 Other chronic pain: Secondary | ICD-10-CM

## 2020-12-15 MED ORDER — TRAMADOL-ACETAMINOPHEN 37.5-325 MG PO TABS: 1.0000 | ORAL_TABLET | ORAL | 5 refills | Status: DC | PRN

## 2020-12-15 NOTE — Patient Instructions (Signed)
While we are working on your approval for MRI please go ahead and call to schedule your appointment with [facility] within at least one (1) week.   Central Scheduling [phone]  

## 2020-12-15 NOTE — Progress Notes (Addendum)
Chief Complaint  Patient presents with  . Wrist Pain    Right   70 year old male fell on his right wrist several weeks ago thought it would get better on its own it did not.  He presented to the ER for x-rays he is shown to have a probable small avulsion fracture from the lunate.  No prior history of wrist problems    Norman Campbell is still complaining of pain and swelling in the dorsal aspect of his wrist which is borne out by his clinical exam with pain swelling dorsum wrist painful wrist flexion cannot make a full fist  MRI will be ordered to evaluate for ligament injury Meds ordered this encounter  Medications  . traMADol-acetaminophen (ULTRACET) 37.5-325 MG tablet    Sig: Take 1 tablet by mouth every 4 (four) hours as needed.    Dispense:  90 tablet    Refill:  5  '

## 2020-12-15 NOTE — Addendum Note (Signed)
Addended by: Fuller Canada E on: 12/15/2020 01:50 PM   Modules accepted: Orders

## 2020-12-22 ENCOUNTER — Other Ambulatory Visit: Payer: Self-pay

## 2020-12-22 ENCOUNTER — Encounter: Payer: Self-pay | Admitting: Nurse Practitioner

## 2020-12-22 ENCOUNTER — Ambulatory Visit (INDEPENDENT_AMBULATORY_CARE_PROVIDER_SITE_OTHER): Payer: Medicare HMO | Admitting: Nurse Practitioner

## 2020-12-22 ENCOUNTER — Ambulatory Visit: Payer: Medicare HMO | Admitting: Nurse Practitioner

## 2020-12-22 DIAGNOSIS — Z0000 Encounter for general adult medical examination without abnormal findings: Secondary | ICD-10-CM | POA: Insufficient documentation

## 2020-12-22 DIAGNOSIS — Z7689 Persons encountering health services in other specified circumstances: Secondary | ICD-10-CM | POA: Diagnosis not present

## 2020-12-22 DIAGNOSIS — R7303 Prediabetes: Secondary | ICD-10-CM

## 2020-12-22 DIAGNOSIS — R42 Dizziness and giddiness: Secondary | ICD-10-CM

## 2020-12-22 DIAGNOSIS — M25531 Pain in right wrist: Secondary | ICD-10-CM

## 2020-12-22 DIAGNOSIS — I10 Essential (primary) hypertension: Secondary | ICD-10-CM

## 2020-12-22 DIAGNOSIS — Z139 Encounter for screening, unspecified: Secondary | ICD-10-CM

## 2020-12-22 MED ORDER — MECLIZINE HCL 25 MG PO TABS: 25.0000 mg | ORAL_TABLET | Freq: Three times a day (TID) | ORAL | 1 refills | Status: DC | PRN

## 2020-12-22 MED ORDER — LOVASTATIN 20 MG PO TABS: 20.0000 mg | ORAL_TABLET | Freq: Every day | ORAL | 1 refills | Status: DC

## 2020-12-22 MED ORDER — LISINOPRIL 20 MG PO TABS: 20.0000 mg | ORAL_TABLET | Freq: Every day | ORAL | 1 refills | Status: DC

## 2020-12-22 NOTE — Patient Instructions (Signed)
Please have fasting labs drawn 2-3 days prior to your appointment so we can discuss the results during your office visit.  

## 2020-12-22 NOTE — Progress Notes (Signed)
New Patient Office Visit  Subjective:  Patient ID: Norman Campbell, male    DOB: 09-18-1950  Age: 70 y.o. MRN: [id number]  CC:  Chief Complaint  Patient presents with  . New Patient (Initial Visit)    HPI [name] presents for new patient visit. Transferring care from [facility] in [place] at [facility]. Last physical and labs were completed over a year ago.  No acute concerns.  He does have right wrist fx and is followed by Dr. Aline Brochure for that.   Past Medical History:  Diagnosis Date  . Diabetes mellitus without complication ([place])   . Hyperlipidemia   . Hypertension     History reviewed. No pertinent surgical history.  Family History  Family history unknown: Yes    Social History   Socioeconomic History  . Marital status: Married    Spouse name: Not on file  . Number of children: 2  . Years of education: Not on file  . Highest education level: Not on file  Occupational History  . 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 Smoking status: Current Every Day Smoker    Packs/day: 1.50    Years: 55.00    Pack years: 82.50    Types: Cigarettes  . Smokeless tobacco: Never Used  Vaping Use  . Vaping Use: Never used  Substance and Sexual Activity  . Alcohol use: Not Currently    Alcohol/week: 0.0 standard drinks  . Drug use: Never  . Sexual activity: Yes  Other Topics Concern  . Not on file  Social History Narrative  . Not on file   Social Determinants of Health   Financial Resource Strain: Not on file  Food Insecurity: Not on file  Transportation Needs: Not on file  Physical Activity: Not on file  Stress: Not on file  Social Connections: Not on file  Intimate Partner Violence: Not on file    ROS Review of Systems  Constitutional: Negative.   Respiratory: Negative.   Cardiovascular: Negative.   Musculoskeletal:       Right wrist fx  Psychiatric/Behavioral: Negative.     Objective:    Today's Vitals: BP (!) 162/85   Pulse (!) 102   Temp 99.2 F (37.3 C)   Resp 18   Ht 6' 2" (1.88 m)   Wt 197 lb (89.4 kg)   SpO2 96%   BMI 25.29 kg/m   Physical Exam Constitutional:      Appearance: Normal appearance.  Cardiovascular:     Rate and Rhythm: Normal rate and regular rhythm.     Pulses: Normal pulses.     Heart sounds: Normal heart sounds.  Pulmonary:     Effort: Pulmonary effort is normal.     Breath sounds: Normal breath sounds.  Musculoskeletal:     Comments: Wright wrist in splint  Neurological:     Mental Status: He is alert.  Psychiatric:        Mood and Affect: Mood normal.        Behavior: Behavior normal.        Thought Content: Thought content normal.        Judgment: Judgment normal.     Assessment & Plan:   Problem List Items Addressed This Visit      Cardiovascular and Mediastinum   HTN (hypertension)    -BP 162/85 today -he is out of medicine, will refill lisinopril today  Relevant Medications   lisinopril (ZESTRIL) 20 MG tablet   lovastatin (MEVACOR) 20 MG tablet   Other Relevant Orders   CBC with Differential/Platelet   CMP14+EGFR   Lipid Panel With LDL/HDL Ratio     Other   Encounter to establish care    -request records      Relevant Orders   CT CHEST LUNG CA SCREEN LOW DOSE W/O CM   CBC with Differential/Platelet   CMP14+EGFR   HCV Ab w/Rflx to Verification   Hemoglobin A1c   Lipid Panel With LDL/HDL Ratio   Screening due    -will screen for HCV and lung CA      Relevant Orders   CT CHEST LUNG CA SCREEN LOW DOSE W/O CM   HCV Ab w/Rflx to Verification   Prediabetes    -took metformin in the past, but hasn't taken this in 5 years -will check A1c -has been taking chromium supplement       Relevant Orders   Hemoglobin A1c   Lipid Panel With LDL/HDL Ratio   Vertigo    -BPPV -takes PRN meclizine -no acute issues today      Right wrist pain    -had a fall -followed by Dr. Aline Brochure -takes PRN  tramadol-APAP         Outpatient Encounter Medications as of 12/22/2020  Medication Sig  . aspirin 81 MG tablet 81 mg.  . Chromium Picolinate (CHROMIUM PICOLATE PO) Take by mouth.  . traMADol-acetaminophen (ULTRACET) 37.5-325 MG tablet Take 1 tablet by mouth every 4 (four) hours as needed.  . [DISCONTINUED] lisinopril (PRINIVIL,ZESTRIL) 20 MG tablet 20 mg.  . [DISCONTINUED] lovastatin (MEVACOR) 20 MG tablet Take 20 mg by mouth at bedtime.  . [DISCONTINUED] meclizine (ANTIVERT) 25 MG tablet 25 mg.  . lisinopril (ZESTRIL) 20 MG tablet Take 1 tablet (20 mg total) by mouth daily.  Marland Kitchen lovastatin (MEVACOR) 20 MG tablet Take 1 tablet (20 mg total) by mouth at bedtime.  . meclizine (ANTIVERT) 25 MG tablet Take 1 tablet (25 mg total) by mouth 3 (three) times daily as needed for dizziness.  . metFORMIN (GLUCOPHAGE) 1000 MG tablet 1,000 mg. (Patient not taking: Reported on 12/22/2020)   No facility-administered encounter medications on file as of 12/22/2020.    Follow-up: Return in about 2 weeks (around 01/05/2021) for Physical exam.   Noreene Larsson, NP

## 2020-12-22 NOTE — Assessment & Plan Note (Signed)
-  will screen for HCV and lung CA

## 2020-12-22 NOTE — Assessment & Plan Note (Signed)
-  BPPV -takes PRN meclizine -no acute issues today

## 2020-12-22 NOTE — Assessment & Plan Note (Addendum)
-  took metformin in the past, but hasn't taken this in 5 years -will check A1c -has been taking chromium supplement

## 2020-12-22 NOTE — Assessment & Plan Note (Signed)
request records

## 2020-12-22 NOTE — Assessment & Plan Note (Signed)
-  BP 162/85 today -he is out of medicine, will refill lisinopril today

## 2020-12-22 NOTE — Assessment & Plan Note (Signed)
-  had a fall -followed by Dr. Romeo Apple -takes PRN tramadol-APAP

## 2020-12-26 ENCOUNTER — Other Ambulatory Visit: Payer: Self-pay

## 2020-12-26 ENCOUNTER — Ambulatory Visit (HOSPITAL_COMMUNITY)
Admission: RE | Admit: 2020-12-26 | Discharge: 2020-12-26 | Disposition: A | Discharge status: Home / Self Care | Payer: Medicare HMO | Source: Ambulatory Visit | Attending: Orthopedic Surgery | Admitting: Orthopedic Surgery

## 2020-12-26 DIAGNOSIS — M25531 Pain in right wrist: Secondary | ICD-10-CM | POA: Insufficient documentation

## 2020-12-26 DIAGNOSIS — G8929 Other chronic pain: Secondary | ICD-10-CM | POA: Diagnosis present

## 2021-01-01 ENCOUNTER — Telehealth: Payer: Self-pay | Admitting: Orthopedic Surgery

## 2021-01-01 NOTE — Telephone Encounter (Signed)
Patient called left message during lunch stating he wants to know the results from his MRI.  Tried to call the patient back and left voicemail to call the office he has an appt already set up to see Dr. Romeo Apple on 01/12/21 at 1:30

## 2021-01-05 ENCOUNTER — Encounter (INDEPENDENT_AMBULATORY_CARE_PROVIDER_SITE_OTHER): Payer: Self-pay | Admitting: *Deleted

## 2021-01-05 ENCOUNTER — Encounter: Payer: Self-pay | Admitting: Nurse Practitioner

## 2021-01-05 ENCOUNTER — Ambulatory Visit (INDEPENDENT_AMBULATORY_CARE_PROVIDER_SITE_OTHER): Payer: Medicare HMO | Admitting: Nurse Practitioner

## 2021-01-05 ENCOUNTER — Other Ambulatory Visit: Payer: Self-pay

## 2021-01-05 VITALS — BP 177/93 | HR 110 | Temp 98.6°F | Resp 20 | Ht 74.0 in | Wt 193.0 lb

## 2021-01-05 DIAGNOSIS — M25531 Pain in right wrist: Secondary | ICD-10-CM

## 2021-01-05 DIAGNOSIS — I10 Essential (primary) hypertension: Secondary | ICD-10-CM | POA: Diagnosis not present

## 2021-01-05 DIAGNOSIS — R7303 Prediabetes: Secondary | ICD-10-CM

## 2021-01-05 DIAGNOSIS — E782 Mixed hyperlipidemia: Secondary | ICD-10-CM | POA: Insufficient documentation

## 2021-01-05 DIAGNOSIS — E785 Hyperlipidemia, unspecified: Secondary | ICD-10-CM

## 2021-01-05 DIAGNOSIS — Z139 Encounter for screening, unspecified: Secondary | ICD-10-CM | POA: Diagnosis not present

## 2021-01-05 MED ORDER — AMLODIPINE-OLMESARTAN 5-20 MG PO TABS
1.0000 | ORAL_TABLET | Freq: Every day | ORAL | 1 refills | Status: DC
Start: 2021-01-05 — End: 2021-01-12

## 2021-01-05 NOTE — Progress Notes (Signed)
Established Patient Office Visit  Subjective:  Patient ID: Norman Campbell, male    DOB: 04-12-1951  Age: 70 y.o. MRN: [id number]  CC:  Chief Complaint  Patient presents with  . Annual Exam    HPI Norman Campbell presents for physical exam. He is fasting for lab draw this AM. No acute concerns.   Past Medical History:  Diagnosis Date  . Diabetes mellitus without complication (HCC)   . Hyperlipidemia   . Hypertension     History reviewed. No pertinent surgical history.  Family History  Family history unknown: Yes    Social History   Socioeconomic History  . Marital status: Married    Spouse name: Not on file  . Number of children: 2  . Years of education: Not on file  . Highest education level: Not on file  Occupational History  . 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 Smoking status: Current Every Day Smoker    Packs/day: 1.50    Years: 55.00    Pack years: 82.50    Types: Cigarettes  . Smokeless tobacco: Never Used  Vaping Use  . Vaping Use: Never used  Substance and Sexual Activity  . Alcohol use: Not Currently    Alcohol/week: 0.0 standard drinks  . Drug use: Never  . Sexual activity: Yes  Other Topics Concern  . Not on file  Social History Narrative  . Not on file   Social Determinants of Health   Financial Resource Strain: Not on file  Food Insecurity: Not on file  Transportation Needs: Not on file  Physical Activity: Not on file  Stress: Not on file  Social Connections: Not on file  Intimate Partner Violence: Not on file    Outpatient Medications Prior to Visit  Medication Sig Dispense Refill  . aspirin 81 MG tablet 81 mg.    . Chromium Picolinate (CHROMIUM PICOLATE PO) Take by mouth.    . lovastatin (MEVACOR) 20 MG tablet Take 1 tablet (20 mg total) by mouth at bedtime. 90 tablet 1  . meclizine (ANTIVERT) 25 MG tablet Take 1 tablet (25 mg total) by mouth 3 (three) times daily as  needed for dizziness. 30 tablet 1  . metFORMIN (GLUCOPHAGE) 1000 MG tablet 1,000 mg.    . traMADol-acetaminophen (ULTRACET) 37.5-325 MG tablet Take 1 tablet by mouth every 4 (four) hours as needed. 90 tablet 5  . lisinopril (ZESTRIL) 20 MG tablet Take 1 tablet (20 mg total) by mouth daily. 90 tablet 1   No facility-administered medications prior to visit.    No Known Allergies  ROS Review of Systems  Constitutional: Negative.   HENT: Negative.   Eyes: Negative.   Respiratory: Negative.   Cardiovascular: Negative.   Gastrointestinal: Negative.   Endocrine: Negative.   Genitourinary: Negative.   Musculoskeletal:       Right hand/wrist pain that has been ongoing since he fractured it  Skin: Negative.   Allergic/Immunologic: Negative.   Neurological: Negative.   Hematological: Negative.   Psychiatric/Behavioral: Negative.       Objective:    Physical Exam Constitutional:      Appearance: Normal appearance.  HENT:     Head: Normocephalic and atraumatic.     Right Ear: Tympanic membrane, ear canal and external ear normal.     Left Ear: Tympanic membrane, ear canal and external ear normal.     Nose: Nose normal.     Mouth/Throat:  Mouth: Mucous membranes are moist.     Pharynx: Oropharynx is clear.  Eyes:     Extraocular Movements: Extraocular movements intact.     Conjunctiva/sclera: Conjunctivae normal.     Pupils: Pupils are equal, round, and reactive to light.  Cardiovascular:     Rate and Rhythm: Normal rate and regular rhythm.     Pulses: Normal pulses.     Heart sounds: Normal heart sounds.  Pulmonary:     Effort: Pulmonary effort is normal.     Breath sounds: Normal breath sounds.  Abdominal:     General: Abdomen is flat. Bowel sounds are normal.     Palpations: Abdomen is soft.  Musculoskeletal:     Cervical back: Normal range of motion and neck supple.     Comments: Right wrist pain when gripping, but otherwise ROM is good  Skin:    General: Skin is  warm and dry.     Capillary Refill: Capillary refill takes less than 2 seconds.  Neurological:     General: No focal deficit present.     Mental Status: He is alert and oriented to person, place, and time. Mental status is at baseline.     Cranial Nerves: No cranial nerve deficit.     Sensory: No sensory deficit.     Motor: No weakness.     Coordination: Coordination normal.  Psychiatric:        Mood and Affect: Mood normal.        Behavior: Behavior normal.        Thought Content: Thought content normal.        Judgment: Judgment normal.     BP (!) 177/93   Pulse (!) 110   Temp 98.6 F (37 C)   Resp 20   Ht 6\' 2"  (1.88 m)   Wt 193 lb (87.5 kg)   SpO2 98%   BMI 24.78 kg/m  Wt Readings from Last 3 Encounters:  01/05/21 193 lb (87.5 kg)  12/22/20 197 lb (89.4 kg)  12/15/20 208 lb (94.3 kg)     Health Maintenance Due  Topic Date Due  . Hepatitis C Screening  Never done  . TETANUS/TDAP  Never done  . COLONOSCOPY (Pts 45-41yrs Insurance coverage will need to be confirmed)  Never done    There are no preventive care reminders to display for this patient.  Lab Results  Component Value Date   TSH 0.957 04/27/2010   Lab Results  Component Value Date   WBC 5.9 04/28/2010   HGB 13.0 04/28/2010   HCT 36.5 (L) 04/28/2010   MCV 88.6 04/28/2010   PLT 261 04/28/2010   Lab Results  Component Value Date   NA 139 04/28/2010   K 3.7 DELTA CHECK NOTED 04/28/2010   CO2 25 04/28/2010   GLUCOSE 175 (H) 04/28/2010   BUN 12 04/28/2010   CREATININE 1.01 04/28/2010   BILITOT 0.6 04/28/2010   ALKPHOS 44 04/28/2010   AST 14 04/28/2010   ALT 16 04/28/2010   PROT 6.9 04/28/2010   ALBUMIN 3.7 04/28/2010   CALCIUM 8.9 04/28/2010   No results found for: CHOL No results found for: HDL No results found for: LDLCALC No results found for: TRIG No results found for: CHOLHDL Lab Results  Component Value Date   HGBA1C (H) 04/27/2010    12.5 (NOTE)  According to the ADA Clinical Practice Recommendations for 2011, when HbA1c is used as a screening test:   >=6.5%   Diagnostic of Diabetes Mellitus           (if abnormal result  is confirmed)  5.7-6.4%   Increased risk of developing Diabetes Mellitus  References:Diagnosis and Classification of Diabetes Mellitus,Diabetes Care,2011,34(Suppl 1):S62-S69 and Standards of Medical Care in         Diabetes - 2011,Diabetes Care,2011,34  (Suppl 1):S11-S61.      Assessment & Plan:   Problem List Items Addressed This Visit      Cardiovascular and Mediastinum   HTN (hypertension)    BP Readings from Last 3 Encounters:  01/05/21 (!) 177/93  12/22/20 (!) 162/85  12/15/20 (!) 177/100  -BP elevated today -STOP lisinopril -Rx. AMLODIPINE-olmesartan       Relevant Medications   amLODipine-olmesartan (AZOR) 5-20 MG tablet     Other   Screening due - Primary   Relevant Orders   Ambulatory referral to Gastroenterology   Prediabetes    -drawing labs today      Right wrist pain    -had a fall -followed by Dr. Romeo Apple -takes PRN tramadol-APAP      Hyperlipidemia    -takes lovastatin 20 mg daily -will check lipid panel      Relevant Medications   amLODipine-olmesartan (AZOR) 5-20 MG tablet      Meds ordered this encounter  Medications  . amLODipine-olmesartan (AZOR) 5-20 MG tablet    Sig: Take 1 tablet by mouth daily.    Dispense:  90 tablet    Refill:  1    Follow-up: Return in about 6 months (around 07/07/2021) for Lab follow-up.    Heather Roberts, NP

## 2021-01-05 NOTE — Assessment & Plan Note (Addendum)
-  takes lovastatin 20 mg daily -will check lipid panel

## 2021-01-05 NOTE — Assessment & Plan Note (Signed)
-  had a fall -followed by Dr. Harrison -takes PRN tramadol-APAP 

## 2021-01-05 NOTE — Assessment & Plan Note (Signed)
-  drawing labs today 

## 2021-01-05 NOTE — Patient Instructions (Addendum)
Please have fasting labs drawn today.  We will meet up again in 6 months.  Please have fasting labs drawn 2-3 days prior to your appointment so we can discuss the results during your office visit.  

## 2021-01-05 NOTE — Assessment & Plan Note (Signed)
BP Readings from Last 3 Encounters:  01/05/21 (!) 177/93  12/22/20 (!) 162/85  12/15/20 (!) 177/100  -BP elevated today -STOP lisinopril -Rx. AMLODIPINE-olmesartan

## 2021-01-06 ENCOUNTER — Encounter (INDEPENDENT_AMBULATORY_CARE_PROVIDER_SITE_OTHER): Payer: Self-pay | Admitting: *Deleted

## 2021-01-06 ENCOUNTER — Other Ambulatory Visit: Payer: Self-pay | Admitting: Nurse Practitioner

## 2021-01-06 LAB — CBC WITH DIFFERENTIAL/PLATELET
Basophils Absolute: 0 10*3/uL (ref 0.0–0.2)
Basos: 1 %
EOS (ABSOLUTE): 0.2 10*3/uL (ref 0.0–0.4)
Eos: 4 %
Hematocrit: 41.5 % (ref 37.5–51.0)
Hemoglobin: 14.2 g/dL (ref 13.0–17.7)
Immature Grans (Abs): 0 10*3/uL (ref 0.0–0.1)
Immature Granulocytes: 0 %
Lymphocytes Absolute: 2.4 10*3/uL (ref 0.7–3.1)
Lymphs: 42 %
MCH: 29 pg (ref 26.6–33.0)
MCHC: 34.2 g/dL (ref 31.5–35.7)
MCV: 85 fL (ref 79–97)
Monocytes Absolute: 0.5 10*3/uL (ref 0.1–0.9)
Monocytes: 8 %
Neutrophils Absolute: 2.6 10*3/uL (ref 1.4–7.0)
Neutrophils: 45 %
Platelets: 392 10*3/uL (ref 150–450)
RBC: 4.89 x10E6/uL (ref 4.14–5.80)
RDW: 12.3 % (ref 11.6–15.4)
WBC: 5.7 10*3/uL (ref 3.4–10.8)

## 2021-01-06 LAB — CMP14+EGFR
ALT: 12 IU/L (ref 0–44)
AST: 16 IU/L (ref 0–40)
Albumin/Globulin Ratio: 1.1 — ABNORMAL LOW (ref 1.2–2.2)
Albumin: 4.1 g/dL (ref 3.8–4.8)
Alkaline Phosphatase: 95 IU/L (ref 44–121)
BUN/Creatinine Ratio: 16 (ref 10–24)
BUN: 22 mg/dL (ref 8–27)
Bilirubin Total: 0.5 mg/dL (ref 0.0–1.2)
CO2: 20 mmol/L (ref 20–29)
Calcium: 10 mg/dL (ref 8.6–10.2)
Chloride: 101 mmol/L (ref 96–106)
Creatinine, Ser: 1.38 mg/dL — ABNORMAL HIGH (ref 0.76–1.27)
Globulin, Total: 3.7 g/dL (ref 1.5–4.5)
Glucose: 150 mg/dL — ABNORMAL HIGH (ref 65–99)
Potassium: 4.5 mmol/L (ref 3.5–5.2)
Sodium: 139 mmol/L (ref 134–144)
Total Protein: 7.8 g/dL (ref 6.0–8.5)
eGFR: 56 mL/min/{1.73_m2} — ABNORMAL LOW (ref >59)

## 2021-01-06 LAB — HEMOGLOBIN A1C
Est. average glucose Bld gHb Est-mCnc: 131 mg/dL
Hgb A1c MFr Bld: 6.2 % — ABNORMAL HIGH (ref 4.8–5.6)

## 2021-01-06 LAB — HCV INTERPRETATION

## 2021-01-06 LAB — LIPID PANEL WITH LDL/HDL RATIO
Cholesterol, Total: 209 mg/dL — ABNORMAL HIGH (ref 100–199)
HDL: 40 mg/dL (ref >39)
LDL Chol Calc (NIH): 151 mg/dL — ABNORMAL HIGH (ref 0–99)
LDL/HDL Ratio: 3.8 ratio — ABNORMAL HIGH (ref 0.0–3.6)
Triglycerides: 102 mg/dL (ref 0–149)
VLDL Cholesterol Cal: 18 mg/dL (ref 5–40)

## 2021-01-06 LAB — HCV AB W/RFLX TO VERIFICATION: HCV Ab: <0.1 s/co ratio (ref 0.0–0.9)

## 2021-01-06 MED ORDER — LOVASTATIN 40 MG PO TABS: 40.0000 mg | ORAL_TABLET | Freq: Every day | ORAL | 3 refills | Status: DC

## 2021-01-06 NOTE — Progress Notes (Signed)
A1c was 6.2% which was at goal.  Kidney function is running just a little low, so increase water intake.  His cholesterol was high, so I increased his dose of lovastatin.

## 2021-01-12 ENCOUNTER — Ambulatory Visit: Payer: Medicare HMO | Admitting: Orthopedic Surgery

## 2021-01-12 ENCOUNTER — Other Ambulatory Visit: Payer: Self-pay | Admitting: Nurse Practitioner

## 2021-01-12 ENCOUNTER — Other Ambulatory Visit: Payer: Self-pay

## 2021-01-12 VITALS — BP 160/80 | HR 100 | Ht 74.0 in | Wt 193.0 lb

## 2021-01-12 DIAGNOSIS — M70031 Crepitant synovitis (acute) (chronic), right wrist: Secondary | ICD-10-CM | POA: Diagnosis not present

## 2021-01-12 MED ORDER — OLMESARTAN MEDOXOMIL 20 MG PO TABS: 20.0000 mg | ORAL_TABLET | Freq: Every day | ORAL | 1 refills | Status: DC

## 2021-01-12 MED ORDER — AMLODIPINE BESYLATE 5 MG PO TABS: 5.0000 mg | ORAL_TABLET | Freq: Every day | ORAL | 1 refills | Status: DC

## 2021-01-12 NOTE — Progress Notes (Signed)
  Chief Complaint  Patient presents with  . Follow-up    Recheck on right wrist      This 70 year old male who is coming back in with his MRI his right wrist feels better after splinting.  He has intermittent swelling episodes which have never been fully diagnosed  MRI shows erosive changes of the bone suggestive of rheumatoid or inflammatory arthritis  He has no history of gout or pseudogout  Recommend laboratory studies CBC BMP at C-reactive protein sed rate rheumatoid factor  Will come back for testing results explanation

## 2021-01-21 ENCOUNTER — Ambulatory Visit (HOSPITAL_COMMUNITY): Admission: RE | Admit: 2021-01-21 | Payer: Medicare HMO | Source: Ambulatory Visit

## 2021-02-23 ENCOUNTER — Other Ambulatory Visit: Payer: Self-pay | Admitting: Nurse Practitioner

## 2021-02-26 ENCOUNTER — Other Ambulatory Visit: Payer: Self-pay | Admitting: Nurse Practitioner

## 2021-03-26 ENCOUNTER — Other Ambulatory Visit: Payer: Self-pay | Admitting: Nurse Practitioner

## 2021-04-28 ENCOUNTER — Ambulatory Visit: Payer: Medicare HMO

## 2021-04-28 ENCOUNTER — Other Ambulatory Visit: Payer: Self-pay

## 2021-04-28 NOTE — Progress Notes (Deleted)
Subjective:   Norman Campbell is a 70 y.o. male who presents for an Initial Medicare Annual Wellness Visit. I connected with  Norman Campbell on 04/28/21 by a audio enabled telemedicine application and verified that I am speaking with the correct person using two identifiers.   I discussed the limitations of evaluation and management by telemedicine. The patient expressed understanding and agreed to proceed.   Review of Systems    Defer to PCP       Objective:    There were no vitals filed for this visit. There is no height or weight on file to calculate BMI.  Advanced Directives 11/03/2020  Does Patient Have a Medical Advance Directive? No    Current Medications (verified) Outpatient Encounter Medications as of 04/28/2021  Medication Sig   amLODipine (NORVASC) 5 MG tablet Take 1 tablet (5 mg total) by mouth daily.   aspirin 81 MG tablet 81 mg.   Chromium Picolinate (CHROMIUM PICOLATE PO) Take by mouth.   lovastatin (MEVACOR) 40 MG tablet Take 1 tablet (40 mg total) by mouth at bedtime.   meclizine (ANTIVERT) 25 MG tablet TAKE 1 TABLET BY MOUTH THREE TIMES DAILY AS NEEDED FOR DIZZINESS   metFORMIN (GLUCOPHAGE) 1000 MG tablet 1,000 mg.   olmesartan (BENICAR) 20 MG tablet Take 1 tablet (20 mg total) by mouth daily.   traMADol-acetaminophen (ULTRACET) 37.5-325 MG tablet Take 1 tablet by mouth every 4 (four) hours as needed.   No facility-administered encounter medications on file as of 04/28/2021.    Allergies (verified) Patient has no known allergies.   History: Past Medical History:  Diagnosis Date   Diabetes mellitus without complication (HCC)    Hyperlipidemia    Hypertension    No past surgical history on file. 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 [employer]  Tobacco Use   Smoking status: Every Day    Packs/day: 1.50    Years: 55.00    Pack years: 82.50    Types: Cigarettes   Smokeless tobacco: Never  Vaping Use   Vaping Use: Never used  Substance and Sexual Activity   Alcohol use: Not Currently    Alcohol/week: 0.0 standard drinks   Drug use: Never   Sexual activity: Yes  Other Topics Concern   Not on file  Social History Narrative   Not on file   Social Determinants of Health   Financial Resource Strain: Not on file  Food Insecurity: Not on file  Transportation Needs: Not on file  Physical Activity: Not on file  Stress: Not on file  Social Connections: Not on file    Tobacco Counseling Ready to quit: Not Answered Counseling given: Not Answered   Clinical Intake:                 Diabetic?***         Activities of Daily Living No flowsheet data found.  Patient Care Team: Heather Roberts, NP as PCP - General (Nurse Practitioner)  Indicate any recent Medical Services you may have received from other than [facility] providers in the past year (date may be approximate).     Assessment:  This is a routine wellness examination for Norman Campbell.  Hearing/Vision screen No results found.  Dietary issues and exercise activities discussed:     Goals Addressed   None    Depression Screen PHQ 2/9 Scores 01/05/2021 12/22/2020  PHQ - 2 Score 0 0    Fall Risk Fall Risk  01/05/2021 12/22/2020  Falls in the past year? 0 1  Number falls in past yr: 0 1  Injury with Fall? 0 1  Risk for fall due to : No Fall Risks Impaired balance/gait  Follow up Falls evaluation completed Falls evaluation completed    FALL RISK PREVENTION PERTAINING TO THE HOME:  Any stairs in or around the home? {YES/NO:21197} If so, are there any without handrails? {YES/NO:21197} Home free of loose throw rugs in walkways, pet beds, electrical cords,  etc? {YES/NO:21197} Adequate lighting in your home to reduce risk of falls? {YES/NO:21197}  ASSISTIVE DEVICES UTILIZED TO PREVENT FALLS:  Life alert? {YES/NO:21197} Use of a cane, walker or w/c? {YES/NO:21197} Grab bars in the bathroom? {YES/NO:21197} Shower chair or bench in shower? {YES/NO:21197} Elevated toilet seat or a handicapped toilet? {YES/NO:21197}  TIMED UP AND GO:  Was the test performed? {YES/NO:21197}.  Length of time to ambulate 10 feet: *** sec.   {Appearance of EUMP:5361443}  Cognitive Function:        Immunizations Immunization History  Administered Date(s) Administered   Moderna Sars-Covid-2 Vaccination 11/13/2019, 12/11/2019    {TDAP status:2101805}  {Flu Vaccine status:2101806}  {Pneumococcal vaccine status:2101807}  {Covid-19 vaccine status:2101808}  Qualifies for Shingles Vaccine? {YES/NO:21197}  Zostavax completed {YES/NO:21197}  {Shingrix Completed?:2101804}  Screening Tests Health Maintenance  Topic Date Due   TETANUS/TDAP  Never done   COLONOSCOPY (Pts 45-54yrs Insurance coverage will need to be confirmed)  Never done   Zoster Vaccines- Shingrix (1 of 2) Never done   COVID-19 Vaccine (3 - Booster for Moderna series) 05/12/2020   INFLUENZA VACCINE  04/13/2021   PNA vac Low Risk Adult (1 of 2 - PCV13) 01/05/2022 (Originally 11/27/2017)   Hepatitis C Screening  Completed   HPV VACCINES  Aged Out    Health Maintenance  Health Maintenance Due  Topic Date Due   TETANUS/TDAP  Never done   COLONOSCOPY (Pts 45-28yrs Insurance coverage will need to be confirmed)  Never done   Zoster Vaccines- Shingrix (1 of 2) Never done   COVID-19 Vaccine (3 - Booster for Moderna series) 05/12/2020   INFLUENZA VACCINE  04/13/2021    {Colorectal cancer screening:2101809}  Lung Cancer Screening: (Low Dose CT Chest recommended if Age 68-80 years, 30 pack-year currently smoking OR have quit w/in 15years.) {DOES NOT does:27190::"does not"} qualify.    Lung Cancer Screening Referral: ***  Additional Screening:  Hepatitis C Screening: {DOES NOT does:27190::"does not"} qualify; Completed ***  Vision Screening: Recommended annual ophthalmology exams for early detection of glaucoma and other disorders of the eye. Is the patient up to date with their annual eye exam?  {YES/NO:21197} Who is the provider or what is the name of the office in which the patient attends annual eye exams? *** If pt is not established with a provider, would they like to be referred to a provider to establish care? {YES/NO:21197}.   Dental Screening: Recommended annual dental exams for proper oral hygiene  Community Resource Referral / Chronic Care Management: CRR required this visit?  {YES/NO:21197}  CCM required this visit?  {YES/NO:21197}     Plan:     I have personally reviewed and noted the following in the  patient's chart:   Medical and social history Use of alcohol, tobacco or illicit drugs  Current medications and supplements including opioid prescriptions. {Opioid Prescriptions:[phone]} Functional ability and status Nutritional status Physical activity Advanced directives List of other physicians Hospitalizations, surgeries, and ER visits in previous 12 months Vitals Screenings to include cognitive, depression, and falls Referrals and appointments  In addition, I have reviewed and discussed with patient certain preventive protocols, quality metrics, and best practice recommendations. A written personalized care plan for preventive services as well as general preventive health recommendations were provided to patient.     Victorio Palm, CMA   04/28/2021   Nurse Notes: ***

## 2021-05-19 ENCOUNTER — Ambulatory Visit (INDEPENDENT_AMBULATORY_CARE_PROVIDER_SITE_OTHER): Payer: Medicare HMO

## 2021-05-19 ENCOUNTER — Other Ambulatory Visit: Payer: Self-pay

## 2021-05-19 DIAGNOSIS — Z0000 Encounter for general adult medical examination without abnormal findings: Secondary | ICD-10-CM | POA: Diagnosis not present

## 2021-05-19 NOTE — Patient Instructions (Signed)
Health Maintenance, Male Adopting a healthy lifestyle and getting preventive care are important in promoting health and wellness. Ask your health care provider about: The right schedule for you to have regular tests and exams. Things you can do on your own to prevent diseases and keep yourself healthy. What should I know about diet, weight, and exercise? Eat a healthy diet  Eat a diet that includes plenty of vegetables, fruits, low-fat dairy products, and lean protein. Do not eat a lot of foods that are high in solid fats, added sugars, or sodium. Maintain a healthy weight Body mass index (BMI) is a measurement that can be used to identify possible weight problems. It estimates body fat based on height and weight. Your health care provider can help determine your BMI and help you achieve or maintain a healthy weight. Get regular exercise Get regular exercise. This is one of the most important things you can do for your health. Most adults should: Exercise for at least 150 minutes each week. The exercise should increase your heart rate and make you sweat (moderate-intensity exercise). Do strengthening exercises at least twice a week. This is in addition to the moderate-intensity exercise. Spend less time sitting. Even light physical activity can be beneficial. Watch cholesterol and blood lipids Have your blood tested for lipids and cholesterol at 68 years of age, then have this test every 5 years. You may need to have your cholesterol levels checked more often if: Your lipid or cholesterol levels are high. You are older than 68 years of age. You are at high risk for heart disease. What should I know about cancer screening? Many types of cancers can be detected early and may often be prevented. Depending on your health history and family history, you may need to have cancer screening at various ages. This may include screening for: Colorectal cancer. Prostate cancer. Skin cancer. Lung  cancer. What should I know about heart disease, diabetes, and high blood pressure? Blood pressure and heart disease High blood pressure causes heart disease and increases the risk of stroke. This is more likely to develop in people who have high blood pressure readings, are of African descent, or are overweight. Talk with your health care provider about your target blood pressure readings. Have your blood pressure checked: Every 3-5 years if you are 18-39 years of age. Every year if you are 40 years old or older. If you are between the ages of 65 and 75 and are a current or former smoker, ask your health care provider if you should have a one-time screening for abdominal aortic aneurysm (AAA). Diabetes Have regular diabetes screenings. This checks your fasting blood sugar level. Have the screening done: Once every three years after age 45 if you are at a normal weight and have a low risk for diabetes. More often and at a younger age if you are overweight or have a high risk for diabetes. What should I know about preventing infection? Hepatitis B If you have a higher risk for hepatitis B, you should be screened for this virus. Talk with your health care provider to find out if you are at risk for hepatitis B infection. Hepatitis C Blood testing is recommended for: Everyone born from 1945 through 1965. Anyone with known risk factors for hepatitis C. Sexually transmitted infections (STIs) You should be screened each year for STIs, including gonorrhea and chlamydia, if: You are sexually active and are younger than 68 years of age. You are older than 68 years   of age and your health care provider tells you that you are at risk for this type of infection. Your sexual activity has changed since you were last screened, and you are at increased risk for chlamydia or gonorrhea. Ask your health care provider if you are at risk. Ask your health care provider about whether you are at high risk for HIV.  Your health care provider may recommend a prescription medicine to help prevent HIV infection. If you choose to take medicine to prevent HIV, you should first get tested for HIV. You should then be tested every 3 months for as long as you are taking the medicine. Follow these instructions at home: Lifestyle Do not use any products that contain nicotine or tobacco, such as cigarettes, e-cigarettes, and chewing tobacco. If you need help quitting, ask your health care provider. Do not use street drugs. Do not share needles. Ask your health care provider for help if you need support or information about quitting drugs. Alcohol use Do not drink alcohol if your health care provider tells you not to drink. If you drink alcohol: Limit how much you have to 0-2 drinks a day. Be aware of how much alcohol is in your drink. In the [place] one drink equals one 12 oz bottle of beer (355 mL), one 5 oz glass of wine (148 mL), or one 1 oz glass of hard liquor (44 mL). General instructions Schedule regular health, dental, and eye exams. Stay current with your vaccines. Tell your health care provider if: You often feel depressed. You have ever been abused or do not feel safe at home. Summary Adopting a healthy lifestyle and getting preventive care are important in promoting health and wellness. Follow your health care provider's instructions about healthy diet, exercising, and getting tested or screened for diseases. Follow your health care provider's instructions on monitoring your cholesterol and blood pressure. This information is not intended to replace advice given to you by your health care provider. Make sure you discuss any questions you have with your health care provider. Document Revised: 11/07/2020 Document Reviewed: 08/23/2018 Elsevier Patient Education  2022 [employer]  

## 2021-05-19 NOTE — Progress Notes (Signed)
Subjective:   Norman Campbell is a 70 y.o. male who presents for an Initial Medicare Annual Wellness Visit. I connected with  Norman Campbell on 05/19/21 by a audio enabled telemedicine application and verified that I am speaking with the correct person using two identifiers.   I discussed the limitations of evaluation and management by telemedicine. The patient expressed understanding and agreed to proceed.   Location of patient:Home  Location of Provider: Office  Persons participating in virtual visit: Norman Campbell (patient) and Victorio PalmGenera Liisa Picone, CMA   Review of Systems    Defer to PCP       Objective:    There were no vitals filed for this visit. There is no height or weight on file to calculate BMI.  Advanced Directives 05/19/2021 11/03/2020  Does Patient Have a Medical Advance Directive? No No    Current Medications (verified) Outpatient Encounter Medications as of 05/19/2021  Medication Sig   aspirin 81 MG tablet 81 mg.   Chromium Picolinate (CHROMIUM PICOLATE PO) Take by mouth.   lovastatin (MEVACOR) 40 MG tablet Take 1 tablet (40 mg total) by mouth at bedtime.   meclizine (ANTIVERT) 25 MG tablet TAKE 1 TABLET BY MOUTH THREE TIMES DAILY AS NEEDED FOR DIZZINESS   olmesartan (BENICAR) 20 MG tablet Take 1 tablet (20 mg total) by mouth daily.   traMADol-acetaminophen (ULTRACET) 37.5-325 MG tablet Take 1 tablet by mouth every 4 (four) hours as needed.   amLODipine (NORVASC) 5 MG tablet Take 1 tablet (5 mg total) by mouth daily. (Patient not taking: Reported on 05/19/2021)   metFORMIN (GLUCOPHAGE) 1000 MG tablet 1,000 mg. (Patient not taking: Reported on 05/19/2021)   No facility-administered encounter medications on file as of 05/19/2021.    Allergies (verified) Patient has no known allergies.   History: Past Medical History:  Diagnosis Date   Diabetes mellitus without complication (HCC)    Hyperlipidemia    Hypertension    History reviewed. No pertinent surgical  history. 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 [employer]  Tobacco Use   Smoking status: Every Day    Packs/day: 1.50    Years: 55.00    Pack years: 82.50    Types: Cigarettes   Smokeless tobacco: Never  Vaping Use   Vaping Use: Never used  Substance and Sexual Activity   Alcohol use: Not Currently    Alcohol/week: 0.0 standard drinks   Drug use: Never   Sexual activity: Yes  Other Topics Concern   Not on file  Social History Narrative   Not on file   Social Determinants of Health   Financial Resource Strain: Low Risk    Difficulty of Paying Living Expenses: Not hard at all  Food Insecurity: No Food Insecurity   Worried About Programme researcher, broadcasting/film/videounning Out of Food in the Last Year: Never true   Ran Out of Food in the Last Year: Never true  Transportation Needs: No Transportation Needs   Lack of Transportation (Medical): No   Lack of Transportation (Non-Medical): No  Physical Activity: Inactive   Days of Exercise per Week: 0 days   Minutes of Exercise per Session: 0 min  Stress:  No Stress Concern Present   Feeling of Stress : Not at all  Social Connections: Moderately Integrated   Frequency of Communication with Friends and Family: More than three times a week   Frequency of Social Gatherings with Friends and Family: More than three times a week   Attends Religious Services: More than 4 times per year   Active Member of [employer] or Organizations: No   Attends Engineer, structural: Never   Marital Status: Married    Tobacco Counseling Ready to quit: Not Answered Counseling given: Not Answered   Clinical Intake:  Pre-visit preparation completed: Yes  Pain : No/denies pain     Nutritional  Risks: None Diabetes: Yes CBG done?: No Did pt. bring in CBG monitor from home?: No  How often do you need to have someone help you when you read instructions, pamphlets, or other written materials from your doctor or pharmacy?: 1 - Never What is the last grade level you completed in school?: 11th Grade  Diabetic?Yes Nutrition Risk Assessment:  Has the patient had any N/V/D within the last 2 months?  No  Does the patient have any non-healing wounds?  No  Has the patient had any unintentional weight loss or weight gain?  No   Diabetes:  Is the patient diabetic?  Yes  If diabetic, was a CBG obtained today?  No  Did the patient bring in their glucometer from home?   N/A How often do you monitor your CBG's? Des not monitor regularly .   Financial Strains and Diabetes Management:  Are you having any financial strains with the device, your supplies or your medication? No .  Does the patient want to be seen by Chronic Care Management for management of their diabetes?  No  Would the patient like to be referred to a Nutritionist or for Diabetic Management?  No   Diabetic Exams:  Diabetic Eye Exam: Overdue for diabetic eye exam. Pt has been advised about the importance in completing this exam. Patient advised to call and schedule an eye exam. Diabetic Foot Exam: Overdue, Pt has been advised about the importance in completing this exam. Pt is scheduled for diabetic foot exam on 07/27/2021.   Interpreter Needed?: No  Information entered by :: Lakeyta Vandenheuvel J,CMA   Activities of Daily Living In your present state of health, do you have any difficulty performing the following activities: 05/19/2021  Hearing? N  Vision? N  Difficulty concentrating or making decisions? N  Walking or climbing stairs? N  Dressing or bathing? N  Doing errands, shopping? N  Preparing Food and eating ? N  Using the Toilet? N  In the past six months, have you accidently leaked urine? N  Do you have problems with  loss of bowel control? N  Managing your Medications? N  Managing your Finances? N  Housekeeping or managing your Housekeeping? N  Some recent data might be hidden    Patient Care Team: Heather Roberts, NP as PCP - General (Nurse Practitioner)  Indicate any recent Medical Services you may have received from other than [facility] providers in the past year (date may be approximate).     Assessment:   This is a routine wellness examination for Norman Campbell.  Hearing/Vision screen No results found.  Dietary issues and exercise activities discussed: Current Exercise Habits: The patient does not participate in regular exercise at present   Goals Addressed   None    Depression Screen [facility] 2/9 Scores 05/19/2021 01/05/2021 12/22/2020  PHQ -  2 Score 0 0 0    Fall Risk Fall Risk  05/19/2021 01/05/2021 12/22/2020  Falls in the past year? 1 0 1  Number falls in past yr: 0 0 1  Injury with Fall? 1 0 1  Risk for fall due to : No Fall Risks No Fall Risks Impaired balance/gait  Follow up Falls evaluation completed Falls evaluation completed Falls evaluation completed    FALL RISK PREVENTION PERTAINING TO THE HOME:  Any stairs in or around the home? No  If so, are there any without handrails? No  Home free of loose throw rugs in walkways, pet beds, electrical cords, etc? Yes  Adequate lighting in your home to reduce risk of falls? Yes   ASSISTIVE DEVICES UTILIZED TO PREVENT FALLS:  Life alert? No  Use of a cane, walker or w/c? No  Grab bars in the bathroom? Yes  Shower chair or bench in shower? No  Elevated toilet seat or a handicapped toilet? Yes   TIMED UP AND GO:  Was the test performed?  N/A .  Length of time to ambulate 10 feet: N/A sec.     Cognitive Function:     6CIT Screen 05/19/2021  What Year? 0 points  What month? 0 points  What time? 0 points  Count back from 20 0 points  Months in reverse 0 points  Repeat phrase 0 points  Total Score 0    Immunizations Immunization  History  Administered Date(s) Administered   Moderna Sars-Covid-2 Vaccination 11/13/2019, 12/11/2019    TDAP status: Due, Education has been provided regarding the importance of this vaccine. Advised may receive this vaccine at local pharmacy or Health Dept. Aware to provide a copy of the vaccination record if obtained from local pharmacy or Health Dept. Verbalized acceptance and understanding.  Flu Vaccine status: Declined, Education has been provided regarding the importance of this vaccine but patient still declined. Advised may receive this vaccine at local pharmacy or Health Dept. Aware to provide a copy of the vaccination record if obtained from local pharmacy or Health Dept. Verbalized acceptance and understanding.  Pneumococcal vaccine status: Due, Education has been provided regarding the importance of this vaccine. Advised may receive this vaccine at local pharmacy or Health Dept. Aware to provide a copy of the vaccination record if obtained from local pharmacy or Health Dept. Verbalized acceptance and understanding.  Covid-19 vaccine status: Information provided on how to obtain vaccines.   Qualifies for Shingles Vaccine? Yes   Zostavax completed  N/A   Shingrix Completed?: No.    Education has been provided regarding the importance of this vaccine. Patient has been advised to call insurance company to determine out of pocket expense if they have not yet received this vaccine. Advised may also receive vaccine at local pharmacy or Health Dept. Verbalized acceptance and understanding.  Screening Tests Health Maintenance  Topic Date Due   TETANUS/TDAP  Never done   COLONOSCOPY (Pts 45-40yrs Insurance coverage will need to be confirmed)  Never done   Zoster Vaccines- Shingrix (1 of 2) Never done   COVID-19 Vaccine (3 - Booster for Moderna series) 05/12/2020   INFLUENZA VACCINE  Never done   PNA vac Low Risk Adult (1 of 2 - PCV13) 01/05/2022 (Originally 11/27/2017)   Hepatitis C  Screening  Completed   HPV VACCINES  Aged Out    Health Maintenance  Health Maintenance Due  Topic Date Due   TETANUS/TDAP  Never done   COLONOSCOPY (Pts 45-76yrs Insurance coverage will  need to be confirmed)  Never done   Zoster Vaccines- Shingrix (1 of 2) Never done   COVID-19 Vaccine (3 - Booster for Moderna series) 05/12/2020   INFLUENZA VACCINE  Never done    Colorectal Cancer Screening: patient will discuss options with PCP. Referral was placed to GI 01/05/2021.   Lung Cancer Screening: (Low Dose CT Chest recommended if Age 59-80 years, 30 pack-year currently smoking OR have quit w/in 15years.) does qualify.   Lung Cancer Screening Referral: Ordered 12/22/2020  Additional Screening:  Hepatitis C Screening: does qualify; Completed 01/05/2021  Vision Screening: Recommended annual ophthalmology exams for early detection of glaucoma and other disorders of the eye. Is the patient up to date with their annual eye exam?  No  Who is the provider or what is the name of the office in which the patient attends annual eye exams? [facility]  If pt is not established with a provider, would they like to be referred to a provider to establish care? No .   Dental Screening: Recommended annual dental exams for proper oral hygiene  Community Resource Referral / Chronic Care Management: CRR required this visit?  No   CCM required this visit?  No      Plan:     I have personally reviewed and noted the following in the patient's chart:   Medical and social history Use of alcohol, tobacco or illicit drugs  Current medications and supplements including opioid prescriptions. Patient is currently taking opioid prescriptions. Information provided to patient regarding non-opioid alternatives. Patient advised to discuss non-opioid treatment plan with their provider. Functional ability and status Nutritional status Physical activity Advanced directives List of other  physicians Hospitalizations, surgeries, and ER visits in previous 12 months Vitals Screenings to include cognitive, depression, and falls Referrals and appointments  In addition, I have reviewed and discussed with patient certain preventive protocols, quality metrics, and best practice recommendations. A written personalized care plan for preventive services as well as general preventive health recommendations were provided to patient.     Victorio Palm, [facility]   05/19/2021   Nurse Notes: Non Face to Face 30 minute visit encounter.   Norman Campbell , Thank you for taking time to come for your Medicare Wellness Visit. I appreciate your ongoing commitment to your health goals. Please review the following plan we discussed and let me know if I can assist you in the future.   These are the goals we discussed:  Goals   None     This is a list of the screening recommended for you and due dates:  Health Maintenance  Topic Date Due   Tetanus Vaccine  Never done   Colon Cancer Screening  Never done   Zoster (Shingles) Vaccine (1 of 2) Never done   COVID-19 Vaccine (3 - Booster for Moderna series) 05/12/2020   Flu Shot  Never done   Pneumonia vaccines (1 of 2 - PCV13) 01/05/2022*   Hepatitis C Screening: USPSTF Recommendation to screen - Ages 95-79 yo.  Completed   HPV Vaccine  Aged Out  *Topic was postponed. The date shown is not the original due date.   Patient advised that office will mail AVS.

## 2021-05-20 ENCOUNTER — Other Ambulatory Visit: Payer: Self-pay | Admitting: Nurse Practitioner

## 2021-06-12 ENCOUNTER — Other Ambulatory Visit: Payer: Self-pay | Admitting: Nurse Practitioner

## 2021-06-23 ENCOUNTER — Other Ambulatory Visit: Payer: Self-pay | Admitting: Nurse Practitioner

## 2021-06-29 ENCOUNTER — Telehealth: Payer: Self-pay

## 2021-06-29 NOTE — Telephone Encounter (Signed)
Patient called need med refills lisinopril 20 mg  Pharmacy: [facility]

## 2021-06-30 ENCOUNTER — Other Ambulatory Visit: Payer: Self-pay | Admitting: Nurse Practitioner

## 2021-07-07 ENCOUNTER — Ambulatory Visit: Payer: Medicare HMO | Admitting: Nurse Practitioner

## 2021-07-19 ENCOUNTER — Other Ambulatory Visit: Payer: Self-pay | Admitting: Nurse Practitioner

## 2021-07-27 ENCOUNTER — Ambulatory Visit: Payer: Medicare HMO | Admitting: Nurse Practitioner

## 2021-07-29 ENCOUNTER — Encounter: Payer: Self-pay | Admitting: Nurse Practitioner

## 2021-07-29 ENCOUNTER — Ambulatory Visit (INDEPENDENT_AMBULATORY_CARE_PROVIDER_SITE_OTHER): Payer: Medicare HMO | Admitting: Nurse Practitioner

## 2021-07-29 ENCOUNTER — Other Ambulatory Visit: Payer: Self-pay

## 2021-07-29 VITALS — BP 160/70 | HR 106 | Ht 74.0 in | Wt 188.0 lb

## 2021-07-29 DIAGNOSIS — M25532 Pain in left wrist: Secondary | ICD-10-CM | POA: Diagnosis not present

## 2021-07-29 DIAGNOSIS — R7303 Prediabetes: Secondary | ICD-10-CM

## 2021-07-29 DIAGNOSIS — F17200 Nicotine dependence, unspecified, uncomplicated: Secondary | ICD-10-CM | POA: Insufficient documentation

## 2021-07-29 DIAGNOSIS — I10 Essential (primary) hypertension: Secondary | ICD-10-CM

## 2021-07-29 DIAGNOSIS — E785 Hyperlipidemia, unspecified: Secondary | ICD-10-CM

## 2021-07-29 DIAGNOSIS — F17210 Nicotine dependence, cigarettes, uncomplicated: Secondary | ICD-10-CM | POA: Diagnosis not present

## 2021-07-29 DIAGNOSIS — M25531 Pain in right wrist: Secondary | ICD-10-CM | POA: Diagnosis not present

## 2021-07-29 MED ORDER — AMLODIPINE BESYLATE 5 MG PO TABS: 10.0000 mg | ORAL_TABLET | Freq: Every day | ORAL | 1 refills | Status: DC

## 2021-07-29 NOTE — Assessment & Plan Note (Addendum)
BP Readings from Last 3 Encounters:  07/29/21 (!) 160/70  01/12/21 (!) 160/80  01/05/21 (!) 177/93  HTN not well controled. Amlodipine increased to 10 mg daily.  Follow up in 4 weeks to rechecK BP . Need for heart healthy low salt / low fat diet discussed . Pt advised to stop smoking.

## 2021-07-29 NOTE — Assessment & Plan Note (Signed)
Labs ordered today  continue lovastatin 40 mg.  Pt stopped taking fish oil Importance of low fat diet and exercise discussed

## 2021-07-29 NOTE — Assessment & Plan Note (Signed)
Pt has been smoking .05 pack of cigarette since age 70. Pt counseled on the need to quit smoking, I discussed the risk associated with smoking he verbalized understanding but he is not willing to quit at this time.Marland Kitchen

## 2021-07-29 NOTE — Assessment & Plan Note (Signed)
C/O bilateral wrist pain Followed by otho . He is out of  PRN tramadol/APAP.  Take OTC tylenol PRN and follow  up with otho.

## 2021-07-29 NOTE — Progress Notes (Signed)
   Norman Campbell     MRN: [id number]      DOB: [birth year]   HPI Norman Campbell is here for follow up and re-evaluation of chronic medical conditions, medication management and review of any available recent lab and radiology data.  Preventive health is updated, specifically  Cancer screening and Immunization.    The PT denies any adverse reactions to current medications since the last visit.  PT c/o of chronic right wrist and left wrist pain, PT been followed by otho, he missed his last appointment with otho. Pt told to use OTC tylenol and follow up with otho.   Need for colonoscopy discussed with pt , he wants to think about it. Pt refused all vaccinations dues, will continue to educate him on the need to get vaccinated.   ROS Denies recent fever or chills. Denies sinus pressure, nasal congestion, ear pain or sore throat. Denies chest congestion, productive cough or wheezing. Denies chest pains, palpitations and leg swelling Denies abdominal pain, nausea, vomiting,diarrhea or constipation.   Denies dysuria, frequency, hesitancy or incontinence. Pt c/o chronic left and right wrist pain, swelling of left wrist and limitation in mobility. Denies headaches, seizures, numbness, or tingling. Denies depression, anxiety or insomnia. Denies skin break down or rash.   PE  BP (!) 160/70   Pulse (!) 106   Ht 6\' 2"  (1.88 m)   Wt 188 lb (85.3 kg)   SpO2 98%   BMI 24.14 kg/m   Patient alert and oriented and in no cardiopulmonary distress.  HEENT: No facial asymmetry, EOMI,     Neck supple .  Chest: Clear to auscultation bilaterally.  CVS: S1, S2 no murmurs, no S3.Regular rate.  ABD: Soft non tender.   Ext: No edema  MS:  left wrist swelling and tenderness on palpation, no redness or warmth, has braces on both wrist. Mild tenderness voiced on ROM of right wrist  Adequate ROM spine, shoulders, hips and knees.  Skin: Intact, no ulcerations or rash noted.  Psych: Good eye  contact, normal affect. Memory intact not anxious or depressed appearing.  CNS: CN 2-12 intact, power,  normal throughout.no focal deficits noted.   Assessment & Plan

## 2021-07-29 NOTE — Assessment & Plan Note (Signed)
Labs ordered today. Importance of low carb modified diet and exercise discussed with pt.

## 2021-07-29 NOTE — Patient Instructions (Addendum)
Follow up in 4 weeks to monitor blood pressure.  Please get your las done tomorrow. Take amlodipine 10 mg daily for your blood pressure in addition to your benicar. Call Call the orthopedic office for your hand pain.   It is important that you exercise regularly at least 30 minutes 5 times a week.  Think about what you will eat, plan ahead. Choose " clean, green, fresh or frozen" over canned, processed or packaged foods which are more sugary, salty and fatty. 70 to 75% of food eaten should be vegetables and fruit. Three meals at set times with snacks allowed between meals, but they must be fruit or vegetables. Aim to eat over a 12 hour period , example 7 am to 7 pm, and STOP after  your last meal of the day. Drink water,generally about 64 ounces per day, no other drink is as healthy. Fruit juice is best enjoyed in a healthy way, by EATING the fruit.    Thanks for choosing [facility], we consider it a privelige to serve you.

## 2021-08-17 ENCOUNTER — Other Ambulatory Visit: Payer: Self-pay | Admitting: Nurse Practitioner

## 2021-08-25 ENCOUNTER — Other Ambulatory Visit: Payer: Self-pay | Admitting: Orthopedic Surgery

## 2021-08-25 DIAGNOSIS — M25531 Pain in right wrist: Secondary | ICD-10-CM

## 2021-08-26 ENCOUNTER — Ambulatory Visit: Payer: Medicare HMO | Admitting: Nurse Practitioner

## 2021-09-08 ENCOUNTER — Other Ambulatory Visit: Payer: Self-pay | Admitting: Nurse Practitioner

## 2021-09-15 ENCOUNTER — Encounter: Payer: Self-pay | Admitting: Nurse Practitioner

## 2021-09-15 ENCOUNTER — Ambulatory Visit (INDEPENDENT_AMBULATORY_CARE_PROVIDER_SITE_OTHER): Payer: Medicare HMO | Admitting: Nurse Practitioner

## 2021-09-15 ENCOUNTER — Other Ambulatory Visit: Payer: Self-pay

## 2021-09-15 VITALS — BP 117/74 | HR 91 | Ht 74.0 in | Wt 176.0 lb

## 2021-09-15 DIAGNOSIS — Z139 Encounter for screening, unspecified: Secondary | ICD-10-CM | POA: Diagnosis not present

## 2021-09-15 DIAGNOSIS — F17210 Nicotine dependence, cigarettes, uncomplicated: Secondary | ICD-10-CM

## 2021-09-15 DIAGNOSIS — R42 Dizziness and giddiness: Secondary | ICD-10-CM

## 2021-09-15 DIAGNOSIS — I10 Essential (primary) hypertension: Secondary | ICD-10-CM

## 2021-09-15 DIAGNOSIS — E785 Hyperlipidemia, unspecified: Secondary | ICD-10-CM | POA: Diagnosis not present

## 2021-09-15 DIAGNOSIS — R634 Abnormal weight loss: Secondary | ICD-10-CM

## 2021-09-15 DIAGNOSIS — R7303 Prediabetes: Secondary | ICD-10-CM | POA: Diagnosis not present

## 2021-09-15 MED ORDER — ENSURE PO LIQD: 237.0000 mL | Freq: Three times a day (TID) | ORAL | 12 refills | Status: DC

## 2021-09-15 NOTE — Assessment & Plan Note (Signed)
Takes lovastatin 40mg  daily, check lipid panel today

## 2021-09-15 NOTE — Assessment & Plan Note (Signed)
Current smoker of over 55 years , not ready to quits , he stated that he is cutting back on smoking, smokes over half a pack per day now. Ned to quit smoking dicussed with pt he verbalized understanding, low dose CT of lungs ordered today.

## 2021-09-15 NOTE — Assessment & Plan Note (Signed)
Check A1c today  On statin and ARB

## 2021-09-15 NOTE — Assessment & Plan Note (Signed)
continue meclizine PRN. Will monitor ringing in the ear for now and refer to ENT if it persist.

## 2021-09-15 NOTE — Progress Notes (Signed)
° °  OMARE BILOTTA     MRN: [id number]      DOB: 12-May-1951   HPI Mr. Severe is here for follow up and re-evaluation of chronic medical conditions, medication management and review of any available recent lab and radiology data.  Preventive health is updated, specifically  Cancer screening and Immunization.   Questions or concerns regarding consultations or procedures which the PT has had in the interim are  addressed. The PT denies any adverse reactions to current medications since the last visit.  There are no new concerns.  There are no specific complaints   Pt c/o ringing in both ears for several months. Denies pain, loss of hearing, discharge, has vertigo.  Pt c/o loss of weight., he has lost about 22 pounds in one year he has been eating well, denies fever, chills, night sweat, bloody sputum,  curent smoker of over 55 years , due forCT scan of the lungs, due for colonscopy.  Pt is  current smoker, he stated that he has been cutting back, smoke  over half a pack per day now.He has been smoking for over 55  years      ROS Denies recent fever or chills. Denies sinus pressure, nasal congestion, ear pain or sore throat. Denies chest congestion, productive cough or wheezing. Denies chest pains, palpitations and leg swelling Denies abdominal pain, nausea, vomiting,diarrhea or constipation.   Denies dysuria, frequency, hesitancy or incontinence. Denies joint pain, swelling and limitation in mobility. Denies headaches, seizures, numbness, or tingling. Denies depression, anxiety or insomnia. Denies skin break down or rash.   PE  BP 117/74 (BP Location: Right Arm, Patient Position: Sitting, Cuff Size: Normal)    Pulse 91    Ht 6\' 2"  (1.88 m)    Wt 176 lb (79.8 kg)    SpO2 99%    BMI 22.60 kg/m   Patient alert and oriented and in no cardiopulmonary distress.  HEENT: No facial asymmetry, EOMI,     Neck supple .  Chest: Clear to auscultation bilaterally.  CVS: S1, S2 no  murmurs, no S3.Regular rate.  ABD: Soft non tender.   Ext: No edema, wears bilateral hand splints  MS: Adequate ROM spine, shoulders, hips and knees.  Skin: Intact, no ulcerations or rash noted.  Psych: Good eye contact, normal affect. Memory intact not anxious or depressed appearing.  CNS: CN 2-12 intact, power,  normal throughout.no focal deficits noted.   Assessment & Plan

## 2021-09-15 NOTE — Assessment & Plan Note (Signed)
DASH diet and commitment to daily physical activity for a minimum of 30 minutes discussed and encouraged, as a part of hypertension management. The importance of attaining a healthy weight is also discussed.  BP/Weight 09/15/2021 07/29/2021 01/12/2021 01/05/2021 12/22/2020 [id number]  Systolic BP 123XX123 0000000 0000000 [id number] 0000000 [id number]  Diastolic BP 74 70 80 93 85 100 101  Wt. (Lbs) 176 188 193 193 197 208 208  BMI 22.6 24.14 24.78 24.78 25.29 26.71 26.71    Continue amlodipine 10mg  daily, olmesartan 20mg  daily

## 2021-09-15 NOTE — Assessment & Plan Note (Addendum)
Unintentional Loss over 20lbs in one year. Check CBC,lipid panel , TSH, EGFR, A1C today. Screening colonoscopy and lung cancer screening CT ordered today. Take ensure supplement  TID. Wt Readings from Last 3 Encounters:  09/15/21 176 lb (79.8 kg)  07/29/21 188 lb (85.3 kg)  01/12/21 193 lb (87.5 kg)

## 2021-09-15 NOTE — Patient Instructions (Signed)
Please get your covid booster, shingles, pneumonia vaccine and tdap vaccine at your pharmacy.   Get your labs done today  It is important that you exercise regularly at least 30 minutes 5 times a week.  Think about what you will eat, plan ahead. Choose " clean, green, fresh or frozen" over canned, processed or packaged foods which are more sugary, salty and fatty. 70 to 75% of food eaten should be vegetables and fruit. Three meals at set times with snacks allowed between meals, but they must be fruit or vegetables. Aim to eat over a 12 hour period , example 7 am to 7 pm, and STOP after  your last meal of the day. Drink water,generally about 64 ounces per day, no other drink is as healthy. Fruit juice is best enjoyed in a healthy way, by EATING the fruit.  Thanks for choosing [facility], we consider it a privelige to serve you.

## 2021-09-16 LAB — CBC
Hematocrit: 35.6 % — ABNORMAL LOW (ref 37.5–51.0)
Hemoglobin: 12.3 g/dL — ABNORMAL LOW (ref 13.0–17.7)
MCH: 29.5 pg (ref 26.6–33.0)
MCHC: 34.6 g/dL (ref 31.5–35.7)
MCV: 85 fL (ref 79–97)
Platelets: 367 10*3/uL (ref 150–450)
RBC: 4.17 x10E6/uL (ref 4.14–5.80)
RDW: 12.6 % (ref 11.6–15.4)
WBC: 5.4 10*3/uL (ref 3.4–10.8)

## 2021-09-16 LAB — TSH: TSH: 2.25 u[IU]/mL (ref 0.450–4.500)

## 2021-09-22 ENCOUNTER — Encounter: Payer: Self-pay | Admitting: Internal Medicine

## 2021-10-22 ENCOUNTER — Ambulatory Visit (HOSPITAL_COMMUNITY): Admission: RE | Admit: 2021-10-22 | Payer: Medicare HMO | Source: Ambulatory Visit

## 2021-11-09 ENCOUNTER — Other Ambulatory Visit: Payer: Self-pay

## 2021-11-09 ENCOUNTER — Ambulatory Visit: Payer: Self-pay

## 2021-11-09 ENCOUNTER — Telehealth: Payer: Self-pay | Admitting: *Deleted

## 2021-11-09 ENCOUNTER — Encounter: Payer: Self-pay | Admitting: Internal Medicine

## 2021-11-09 NOTE — Telephone Encounter (Signed)
Pt was scheduled for a 3:00 nurse visit.  Tried to call pt x 2 times.  Had to leave a voice mail.

## 2022-01-04 ENCOUNTER — Other Ambulatory Visit: Payer: Self-pay

## 2022-01-04 MED ORDER — OLMESARTAN MEDOXOMIL 20 MG PO TABS: 20.0000 mg | ORAL_TABLET | Freq: Every day | ORAL | 1 refills | Status: DC

## 2022-01-13 ENCOUNTER — Encounter: Payer: Medicare HMO | Admitting: Nurse Practitioner

## 2022-01-17 IMAGING — DX DG HAND COMPLETE 3+V*R*
3 series · 3 of 3 positions shown · non-contrast
Comparison: None.

CLINICAL DATA: Hand and wrist pain after fall in [REDACTED].

EXAM:
RIGHT HAND - COMPLETE 3+ VIEW

[hand pa]
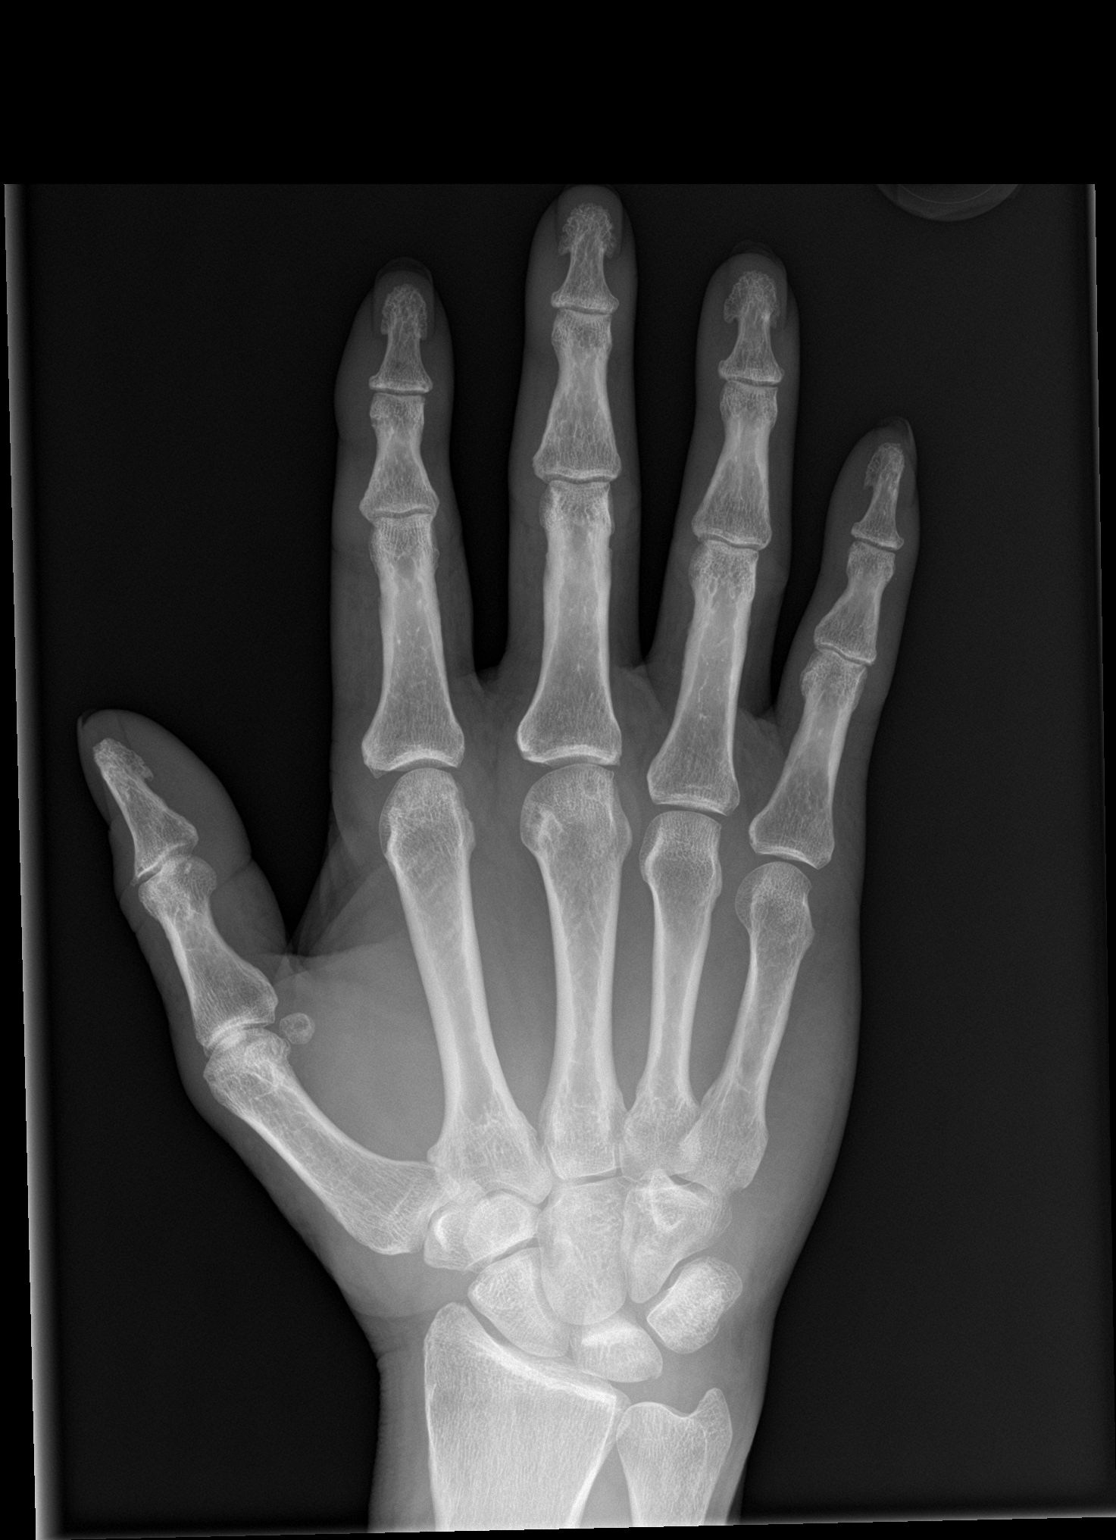

[hand obl]
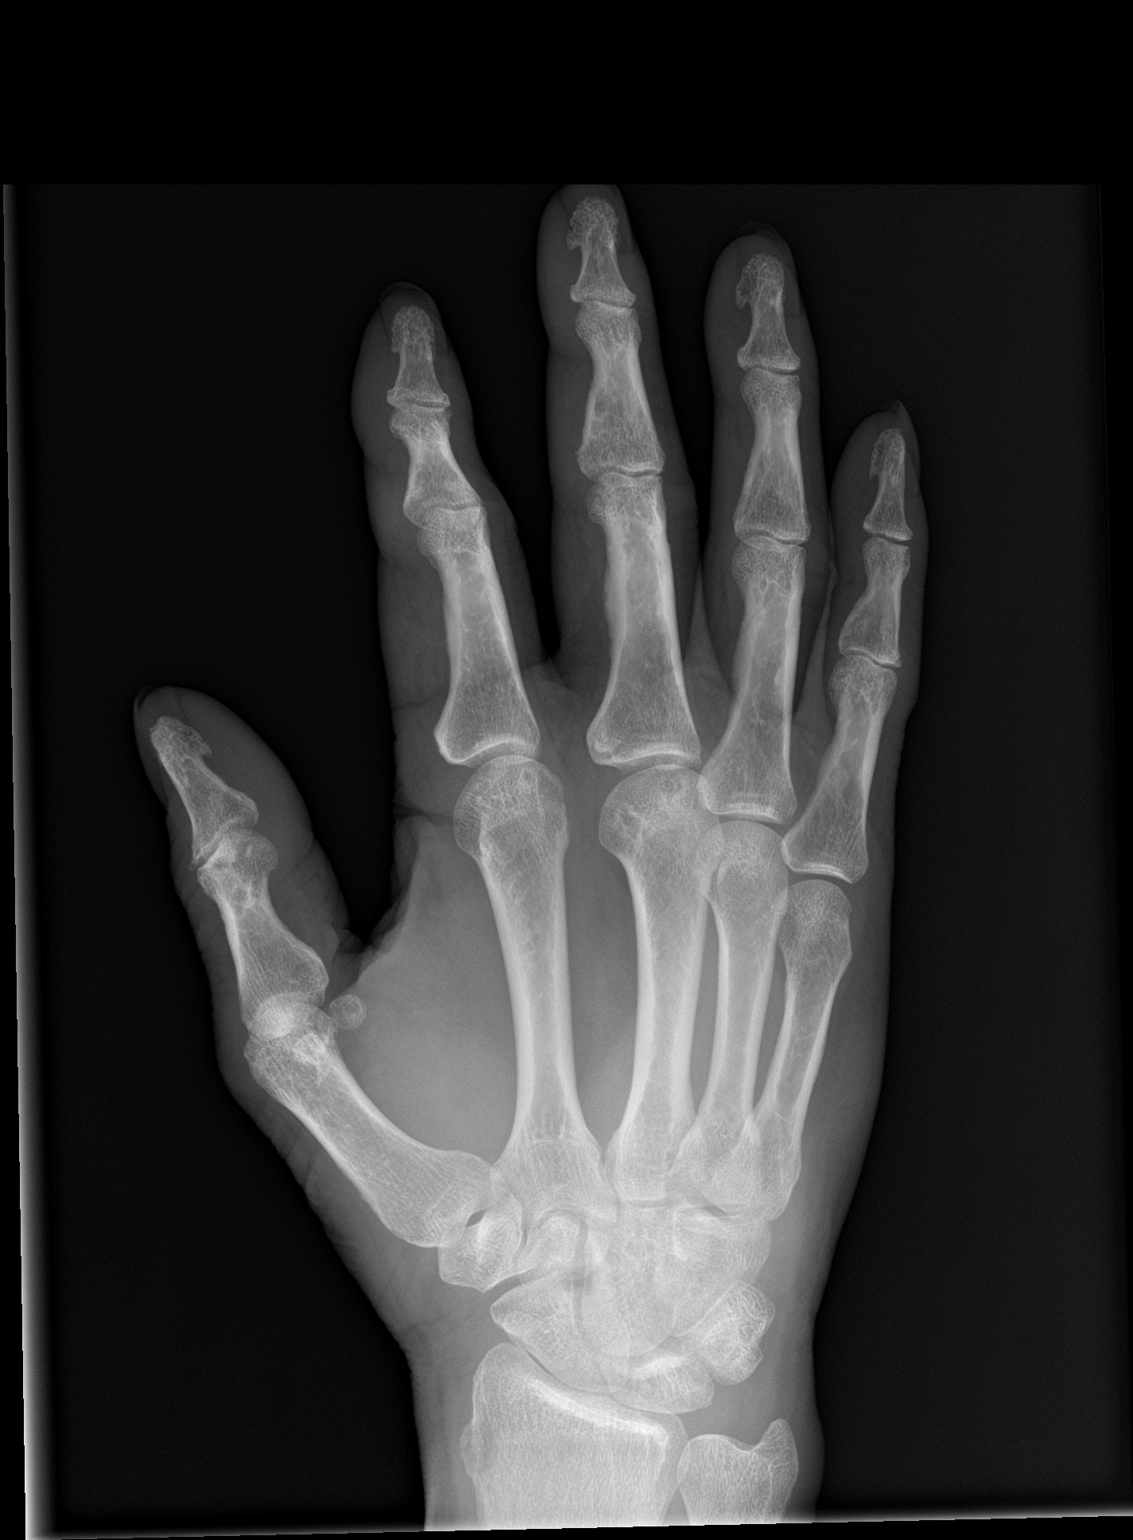

[hand lat]
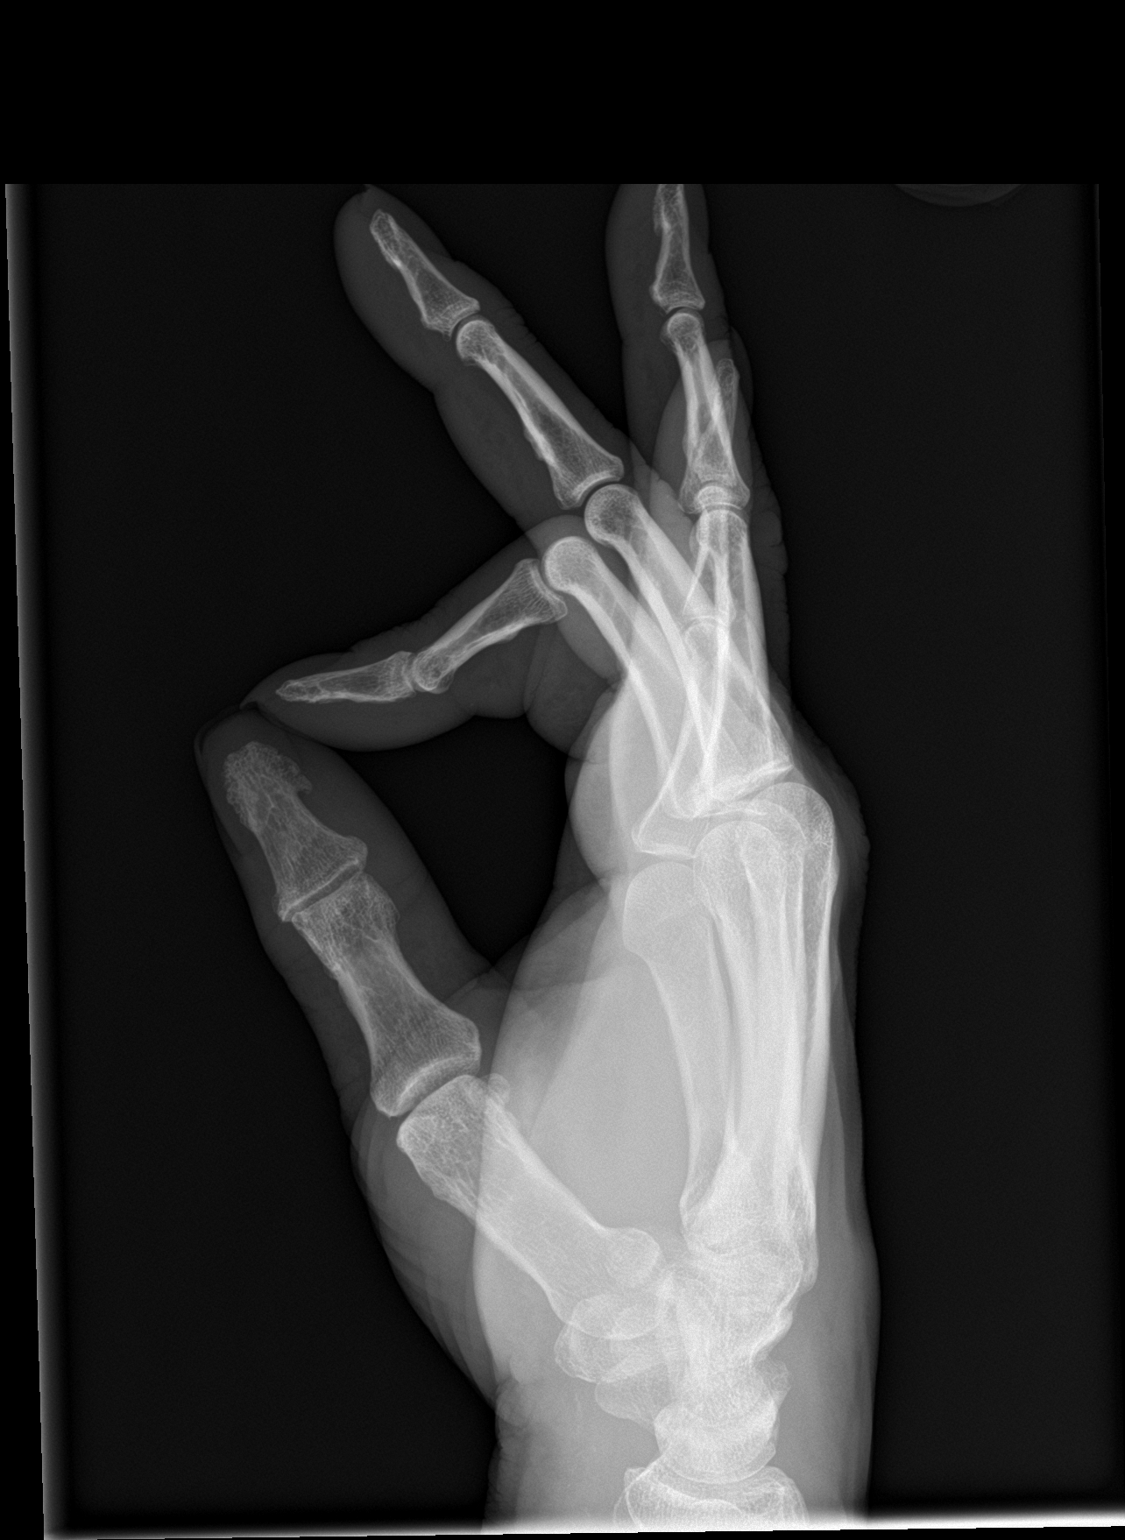

[3 of 3 positions shown; findings below may reference images not displayed]

FINDINGS: Soft tissue swelling dorsal to the carpus. No visible fracture or
subluxation. No opaque foreign body.
IMPRESSION: Negative for fracture or malalignment.

## 2022-01-17 IMAGING — DX DG WRIST COMPLETE 3+V*R*
4 series · 4 of 4 positions shown · non-contrast
Comparison: None.

CLINICAL DATA: Pain in the right hand and wrist after fall in
[REDACTED]

EXAM:
RIGHT WRIST - COMPLETE 3+ VIEW

[wrist pa]
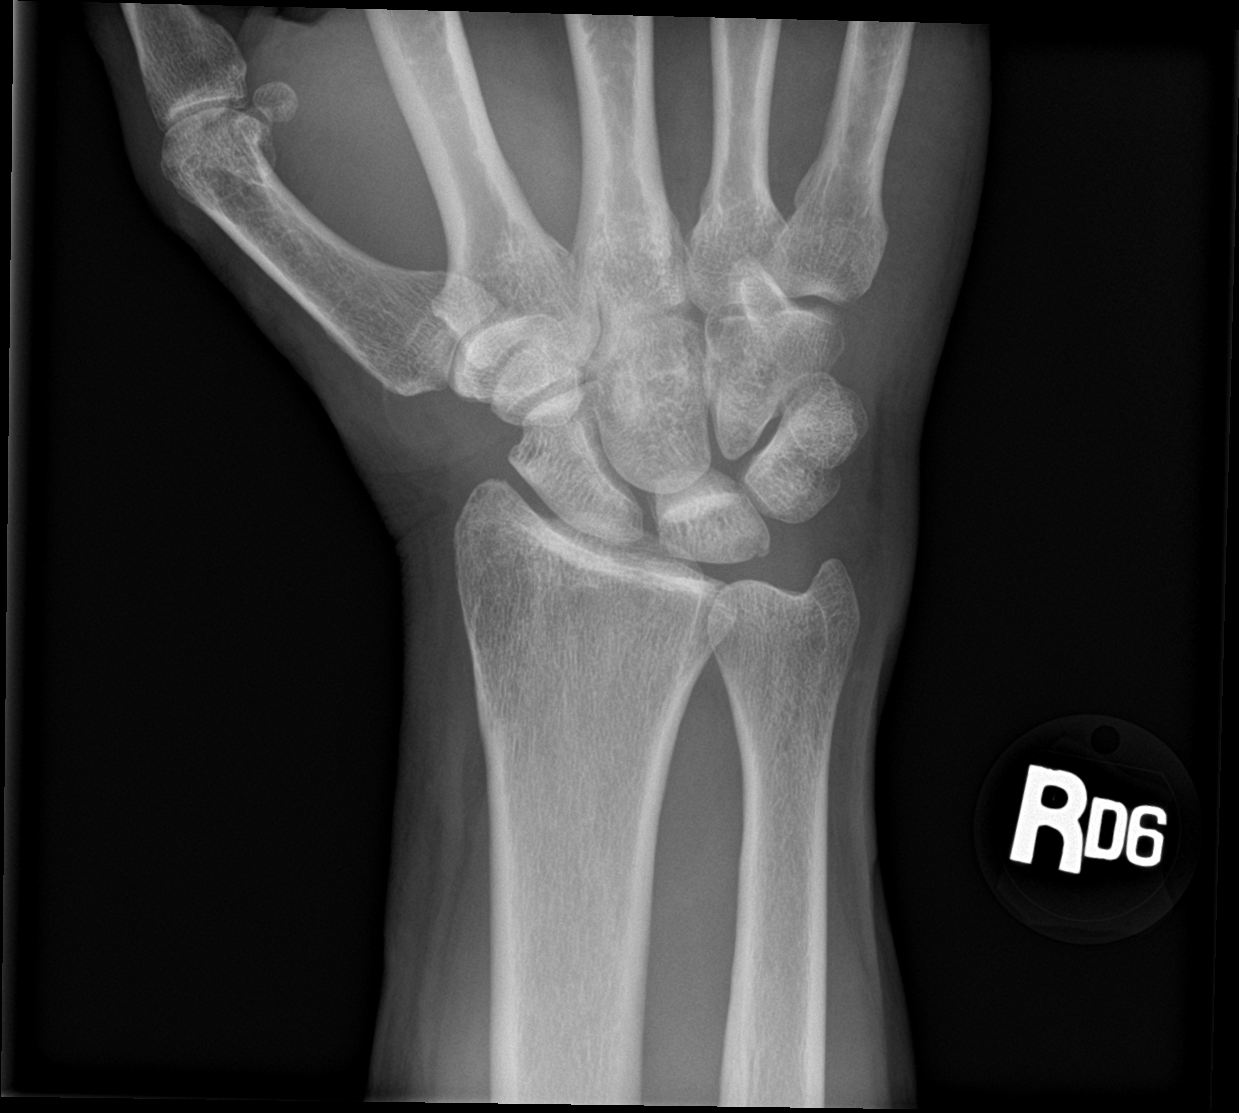

[wrist obl]
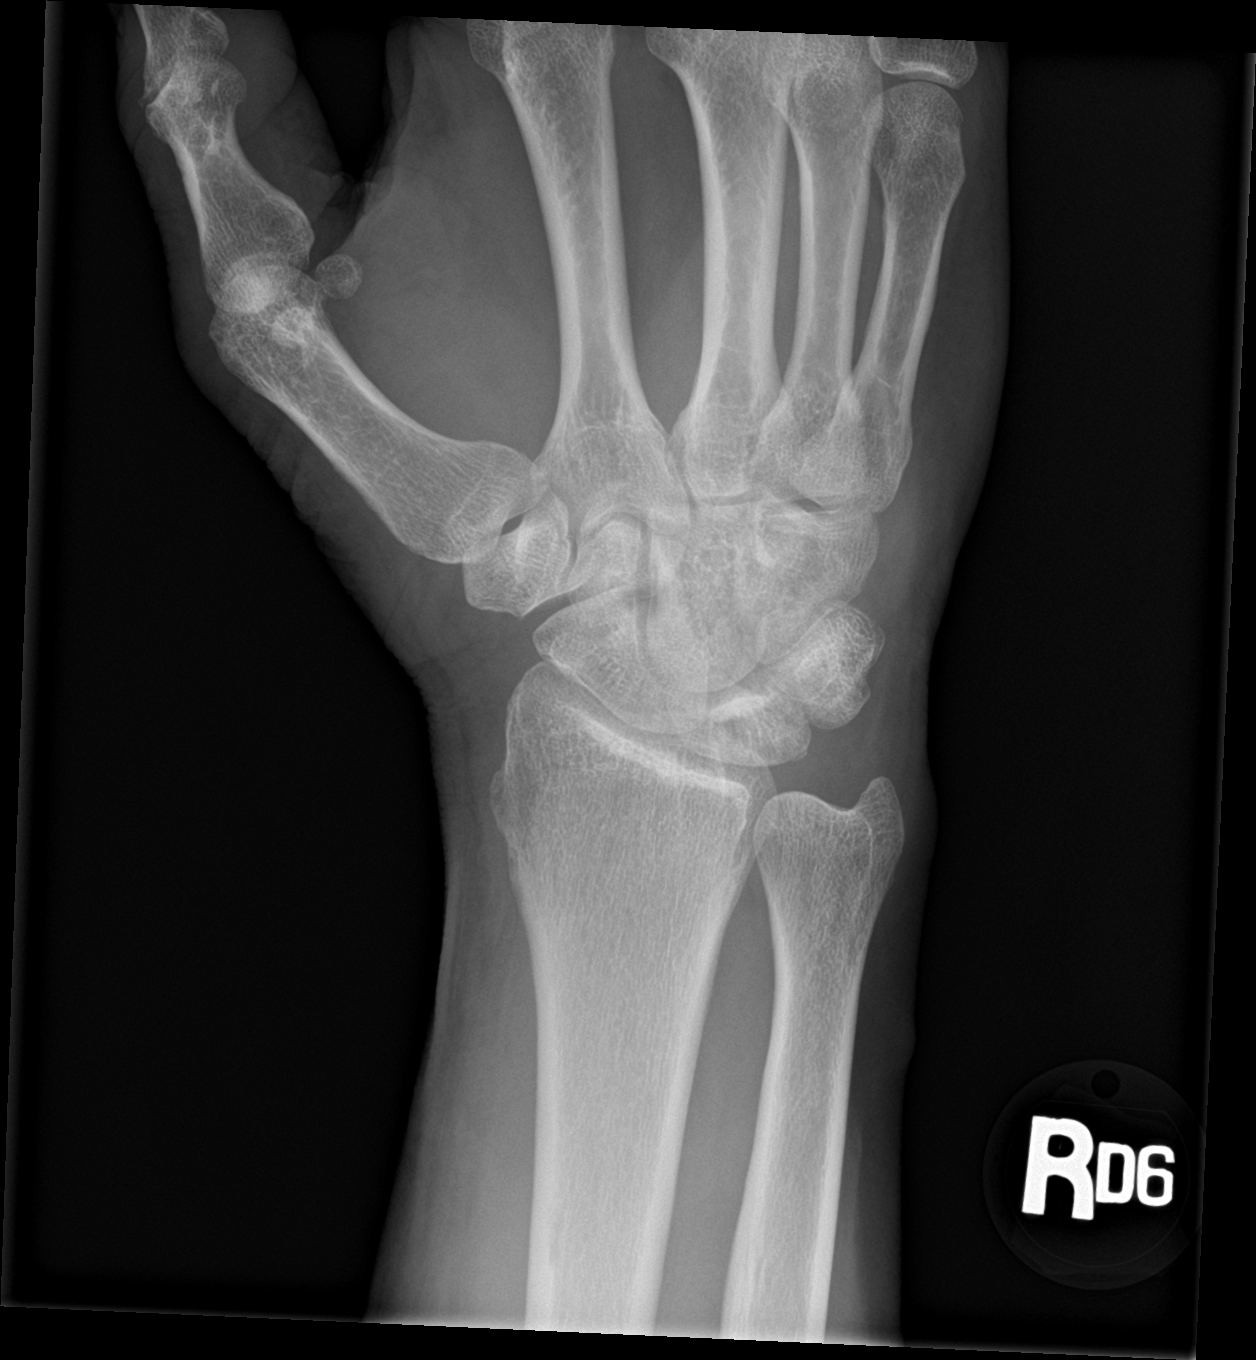

[wrist lat]
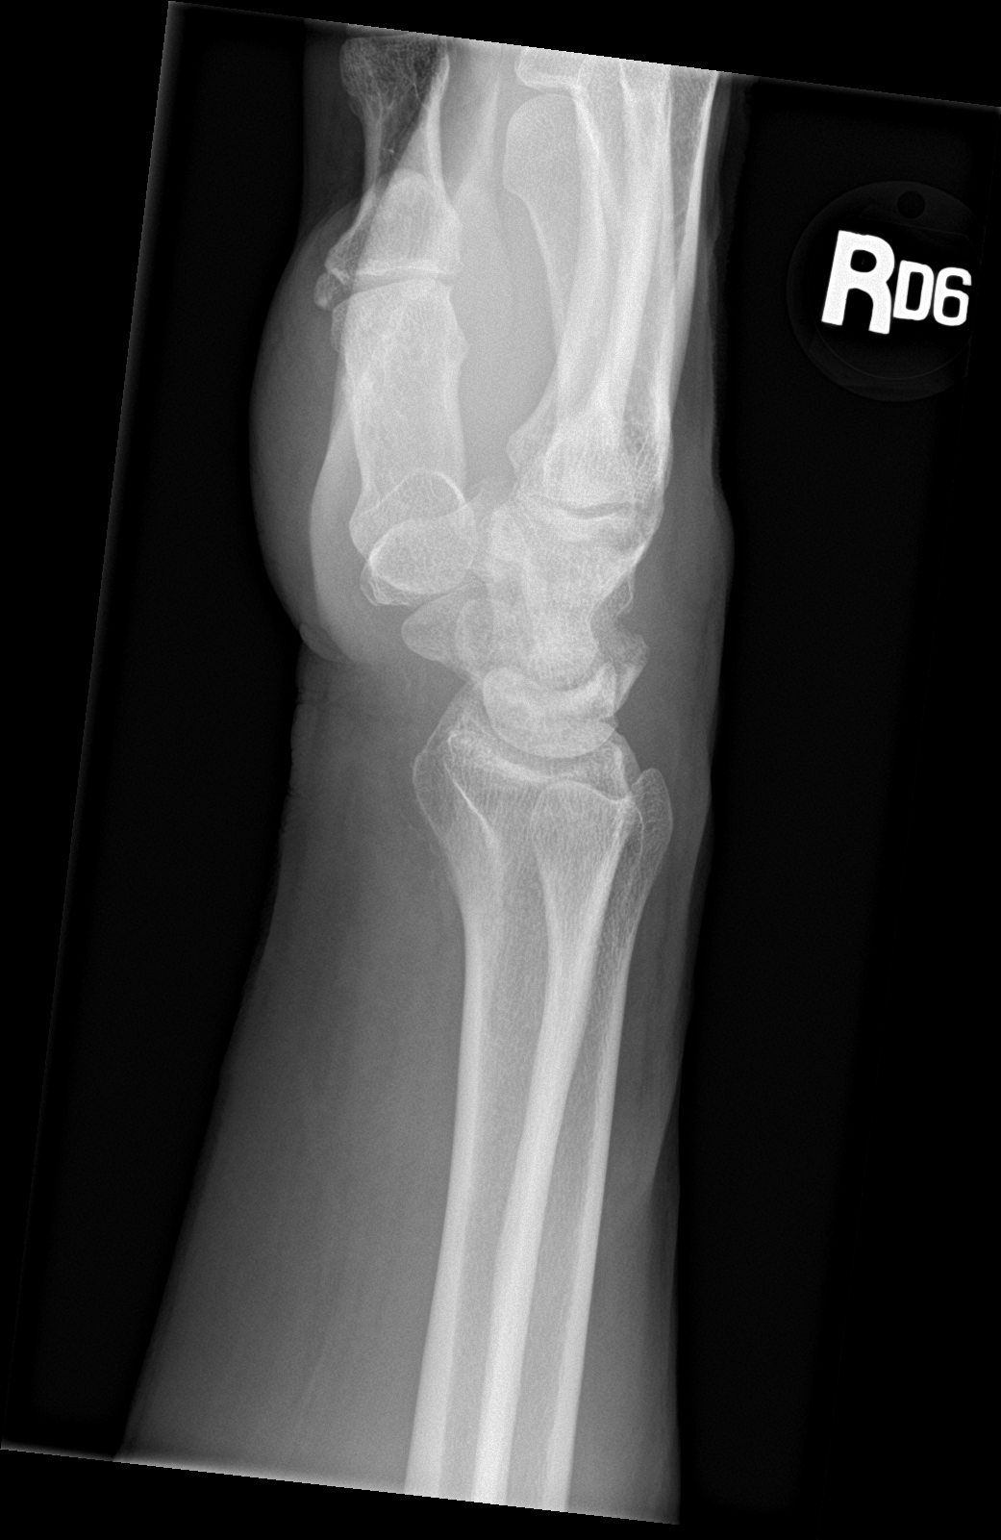

[wrist navicular]
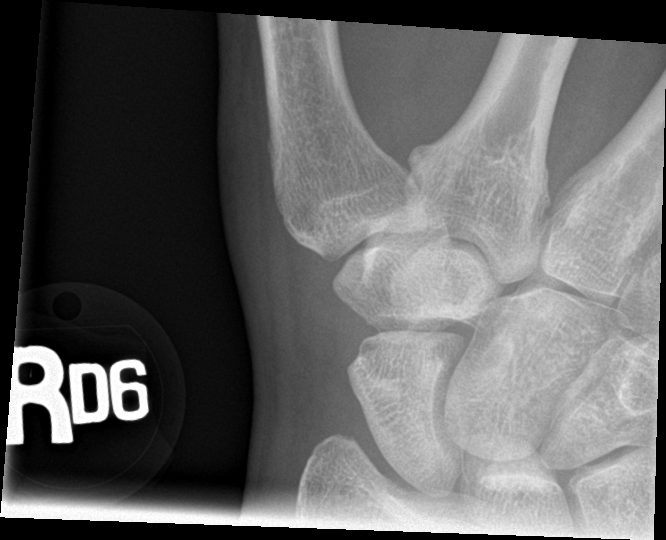

[4 of 4 positions shown; findings below may reference images not displayed]

FINDINGS: Dorsal soft tissue swelling at the level of the carpus. Tiny
fragment adjacent to the proximal and medial sided lunate. Mild
calcification seen in the region of the radial artery.
IMPRESSION: Tiny flake of bone near the proximal lunate which is age
indeterminate, possible recent avulsion fracture in this setting.

## 2022-04-08 ENCOUNTER — Encounter: Payer: Medicare HMO | Admitting: Nurse Practitioner

## 2022-04-08 ENCOUNTER — Encounter: Payer: Self-pay | Admitting: Nurse Practitioner

## 2022-04-12 ENCOUNTER — Other Ambulatory Visit: Payer: Self-pay | Admitting: Nurse Practitioner

## 2022-07-15 ENCOUNTER — Other Ambulatory Visit: Payer: Self-pay | Admitting: Nurse Practitioner

## 2022-07-19 ENCOUNTER — Other Ambulatory Visit: Payer: Self-pay | Admitting: Nurse Practitioner

## 2022-07-19 NOTE — Telephone Encounter (Signed)
Needs appt scheduled

## 2022-07-19 NOTE — Telephone Encounter (Signed)
Called pt to schedule an appt, does not have scheduled appt.

## 2022-08-11 ENCOUNTER — Ambulatory Visit (INDEPENDENT_AMBULATORY_CARE_PROVIDER_SITE_OTHER): Payer: Medicare HMO | Admitting: Family Medicine

## 2022-08-11 ENCOUNTER — Encounter: Payer: Self-pay | Admitting: Family Medicine

## 2022-08-11 VITALS — BP 158/78 | HR 90 | Ht 74.0 in | Wt 180.1 lb

## 2022-08-11 DIAGNOSIS — F17210 Nicotine dependence, cigarettes, uncomplicated: Secondary | ICD-10-CM | POA: Diagnosis not present

## 2022-08-11 DIAGNOSIS — I10 Essential (primary) hypertension: Secondary | ICD-10-CM | POA: Diagnosis not present

## 2022-08-11 DIAGNOSIS — E785 Hyperlipidemia, unspecified: Secondary | ICD-10-CM

## 2022-08-11 DIAGNOSIS — M79641 Pain in right hand: Secondary | ICD-10-CM | POA: Diagnosis not present

## 2022-08-11 DIAGNOSIS — G8929 Other chronic pain: Secondary | ICD-10-CM | POA: Diagnosis not present

## 2022-08-11 DIAGNOSIS — Z0001 Encounter for general adult medical examination with abnormal findings: Secondary | ICD-10-CM | POA: Insufficient documentation

## 2022-08-11 MED ORDER — LOVASTATIN 40 MG PO TABS: 40.0000 mg | ORAL_TABLET | Freq: Every day | ORAL | 3 refills | Status: DC

## 2022-08-11 NOTE — Assessment & Plan Note (Addendum)
Encourage conservative management with oral tylenol, hand brace, and heat therapy to improve circulation and relax the muscles  Encouraged the patient to inform me for worsening of symptoms

## 2022-08-11 NOTE — Progress Notes (Signed)
Complete physical exam  Patient: Norman Campbell   DOB: Jun 28, 1951   71 y.o. Male  MRN: [id number]  Subjective:    Chief Complaint  Patient presents with   Annual Exam    Cpe today. Pt would like to discuss hand pain on both arms states he had a fall 2 years ago (08/11/2020) hand pain has progressively gotten worse.     Norman Campbell is a 71 y.o. male who presents today for a complete physical exam. He reports consuming a general diet. Home exercise routine includes walking at home daily. He generally feels well. He reports sleeping well. He does  have additional problems to discuss today.  Right hand pain: He complains of occasional morning stiffness of the right hand with pain.  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  He denies any signs of inflammation, recent injury or trauma to the affected hand.  Range of motion of the fingers and joints is intact.  Most recent fall risk assessment:    08/11/2022    1:15 PM  Fall Risk   Falls in the past year? 0  Number falls in past yr: 0  Injury with Fall? 0  Risk for fall due to : No Fall Risks  Follow up Falls evaluation completed     Most recent depression screenings:    08/11/2022    1:15 PM 07/29/2021    9:34 AM  PHQ 2/9 Scores  PHQ - 2 Score 0 0  PHQ- 9 Score 0     Dental: No current dental problems the patient has dentures  Patient Active Problem List   Diagnosis Date Noted   Encounter for annual general medical examination with abnormal findings in adult 08/11/2022   Chronic hand pain, right 08/11/2022   Unintentional weight loss 09/15/2021   Nicotine addiction 07/29/2021   Hyperlipidemia 01/05/2021   Routine medical exam 12/22/2020   Screening due 12/22/2020   Prediabetes 12/22/2020   HTN (hypertension) 12/22/2020   Vertigo 12/22/2020   Bilateral wrist pain 12/22/2020   Past Medical History:  Diagnosis Date   Diabetes mellitus  without complication ([place])    Hyperlipidemia    Hypertension    History reviewed. No pertinent surgical history. Social History   Tobacco Use   Smoking status: Every Day    Packs/day: 1.00    Years: 55.00    Total pack years: 55.00    Types: Cigarettes   Smokeless tobacco: Never   Tobacco comments:    Smoke a pack a day  Vaping Use   Vaping Use: Never used  Substance Use Topics   Alcohol use: Not Currently    Alcohol/week: 0.0 standard drinks of alcohol   Drug use: Never   Social History   Socioeconomic History   Marital status: Married    Spouse name: Not on file   Number of children: 2   Years of education: Not on file   Highest education level: Not on file  Occupational History   Occupation: Retired    Comment: 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 [address]  Tobacco Use   Smoking status: Every Day    Packs/day: 1.00    Years: 55.00    Total pack years: 55.00    Types: Cigarettes   Smokeless tobacco: Never   Tobacco comments:    Smoke a pack a day  Vaping Use   Vaping Use: Never  used  Substance and Sexual Activity   Alcohol use: Not Currently    Alcohol/week: 0.0 standard drinks of alcohol   Drug use: Never   Sexual activity: Yes  Other Topics Concern   Not on file  Social History Narrative   Not on file   Social Determinants of Health   Financial Resource Strain: Low Risk  (05/19/2021)   Overall Financial Resource Strain (CARDIA)    Difficulty of Paying Living Expenses: Not hard at all  Food Insecurity: No Food Insecurity (05/19/2021)   Hunger Vital Sign    Worried About Running Out of Food in the Last Year: Never true    [place] in the Last Year: Never true  Transportation Needs: No Transportation Needs (05/19/2021)   PRAPARE - Hydrologist (Medical): No    Lack of Transportation (Non-Medical): No  Physical Activity: Inactive (05/19/2021)   Exercise Vital Sign    Days of Exercise per Week: 0 days    Minutes of Exercise per  Session: 0 min  Stress: No Stress Concern Present (05/19/2021)   [place]    Feeling of Stress : Not at all  Social Connections: Moderately Integrated (05/19/2021)   Social Connection and Isolation Panel [NHANES]    Frequency of Communication with Friends and Family: More than three times a week    Frequency of Social Gatherings with Friends and Family: More than three times a week    Attends Religious Services: More than 4 times per year    Active Member of [employer] or Organizations: No    Attends Archivist Meetings: Never    Marital Status: Married  Human resources officer Violence: Not At Risk (05/19/2021)   Humiliation, Afraid, Rape, and Kick questionnaire    Fear of Current or Ex-Partner: No    Emotionally Abused: No    Physically Abused: No    Sexually Abused: No   No family status information on file.   Family History  Family history unknown: Yes   No Known Allergies    Patient Care Team: Alvira Monday, FNP as PCP - General (Family Medicine)   Outpatient Medications Prior to Visit  Medication Sig   acetaminophen (TYLENOL) 500 MG tablet Take 500 mg by mouth every 6 (six) hours as needed.   amLODipine (NORVASC) 5 MG tablet Take 2 tablets by mouth once daily   aspirin 81 MG tablet 81 mg.   Chromium Picolinate (CHROMIUM PICOLATE PO) Take by mouth.   Ensure (ENSURE) Take 237 mLs by mouth 3 (three) times daily with meals.   meclizine (ANTIVERT) 25 MG tablet TAKE 1 TABLET BY MOUTH THREE TIMES DAILY AS NEEDED FOR DIZZINESS   olmesartan (BENICAR) 20 MG tablet Take 1 tablet by mouth once daily   [DISCONTINUED] lovastatin (MEVACOR) 40 MG tablet Take 1 tablet (40 mg total) by mouth at bedtime.   No facility-administered medications prior to visit.    Review of Systems  Constitutional:  Negative for chills and fever.  HENT:  Negative for congestion and sore throat.   Eyes:  Negative for double vision and  photophobia.  Respiratory:  Negative for cough and shortness of breath.   Cardiovascular:  Negative for chest pain and palpitations.  Gastrointestinal:  Negative for diarrhea and vomiting.  Genitourinary:  Negative for frequency and urgency.  Musculoskeletal:  Negative for falls.  Neurological:  Negative for dizziness and headaches.  Psychiatric/Behavioral:  Negative for memory loss and  suicidal ideas.        Objective:    BP (!) 158/78 (BP Location: Left Arm)   Pulse 90   Ht _0  (1.88 m)   Wt 180 lb 1.9 oz (81.7 kg)   SpO2 98%   BMI 23.13 kg/m  BP Readings from Last 3 Encounters:  08/11/22 (!) 158/78  09/15/21 117/74  07/29/21 (!) 160/70   Wt Readings from Last 3 Encounters:  08/11/22 180 lb 1.9 oz (81.7 kg)  09/15/21 176 lb (79.8 kg)  07/29/21 188 lb (85.3 kg)      Physical Exam HENT:     Head: Normocephalic.     Right Ear: External ear normal.     Left Ear: External ear normal.     Nose: Nose normal.     Mouth/Throat:     Mouth: Mucous membranes are moist.  Eyes:     Extraocular Movements: Extraocular movements intact.     Pupils: Pupils are equal, round, and reactive to light.  Cardiovascular:     Rate and Rhythm: Normal rate and regular rhythm.     Pulses: Normal pulses.     Heart sounds: Normal heart sounds.  Pulmonary:     Effort: Pulmonary effort is normal.     Breath sounds: Normal breath sounds. No wheezing.  Abdominal:     Palpations: Abdomen is soft.     Tenderness: There is no right CVA tenderness or left CVA tenderness.  Musculoskeletal:     Right hand: No swelling, deformity, tenderness or bony tenderness. Normal range of motion.     Cervical back: No rigidity.     Right lower leg: No edema.     Left lower leg: No edema.  Skin:    Findings: No lesion or rash.  Neurological:     Mental Status: He is alert and oriented to person, place, and time.  Psychiatric:     Comments: Normal affect     No results found for any visits on  08/11/22. Last CBC Lab Results  Component Value Date   WBC 5.4 09/15/2021   HGB 12.3 (L) 09/15/2021   HCT 35.6 (L) 09/15/2021   MCV 85 09/15/2021   MCH 29.5 09/15/2021   RDW 12.6 09/15/2021   PLT 367 86/76/1948   Last metabolic panel Lab Results  Component Value Date   GLUCOSE 150 (H) 01/05/2021   NA 139 01/05/2021   K 4.5 01/05/2021   CL 101 01/05/2021   CO2 20 01/05/2021   BUN 22 01/05/2021   CREATININE 1.38 (H) 01/05/2021   EGFR 56 (L) 01/05/2021   CALCIUM 10.0 01/05/2021   PROT 7.8 01/05/2021   ALBUMIN 4.1 01/05/2021   LABGLOB 3.7 01/05/2021   AGRATIO 1.1 (L) 01/05/2021   BILITOT 0.5 01/05/2021   ALKPHOS 95 01/05/2021   AST 16 01/05/2021   ALT 12 01/05/2021   Last lipids Lab Results  Component Value Date   CHOL 209 (H) 01/05/2021   HDL 40 01/05/2021   LDLCALC 151 (H) 01/05/2021   TRIG 102 01/05/2021   Last hemoglobin A1c Lab Results  Component Value Date   HGBA1C 6.2 (H) 01/05/2021   Last thyroid functions Lab Results  Component Value Date   TSH 2.250 09/15/2021   Last vitamin D No results found for: "25OHVITD2", "25OHVITD3", "VD25OH" Last vitamin B12 and Folate No results found for: "VITAMINB12", "FOLATE"      Assessment & Plan:    Routine Health Maintenance and Physical Exam  Immunization History  Administered Date(s)  Administered   Moderna Sars-Covid-2 Vaccination 11/13/2019, 12/11/2019    Health Maintenance  Topic Date Due   DTaP/Tdap/Td (1 - Tdap) Never done   COLONOSCOPY (Pts 45-89yr Insurance coverage will need to be confirmed)  Never done   Lung Cancer Screening  Never done   Zoster Vaccines- Shingrix (1 of 2) Never done   COVID-19 Vaccine (3 - 2023-24 season) 05/14/2022   Medicare Annual Wellness (AWV)  05/19/2022   INFLUENZA VACCINE  12/12/2022 (Originally 04/13/2022)   Pneumonia Vaccine 69 Years old (1 - PCV) 08/12/2023 (Originally 11/28/1956)   Hepatitis C Screening  Completed   HPV VACCINES  Aged Out    Discussed health  benefits of physical activity, and encouraged him to engage in regular exercise appropriate for his age and condition.  Hypertension, unspecified type  Encounter for annual general medical examination with abnormal findings in adult Assessment & Plan: Physical exam as documented Counseling is done on healthy lifestyle involving commitment to 150 minutes of exercise per week,  Discussed heart-healthy diet  He reports getting colon cancer screening January as he is too busy now taking care of his mother He declines all vaccinations at this time        Chronic hand pain, right Assessment & Plan: Encourage conservative management with oral tylenol, hand brace, and heat therapy to improve circulation and relax the muscles  Encouraged the patient to inform me for worsening of symptoms    Cigarette nicotine dependence without complication  Hyperlipidemia, unspecified hyperlipidemia type -     Lovastatin; Take 1 tablet (40 mg total) by mouth at bedtime.  Dispense: 90 tablet; Refill: 3    Return in about 2 weeks (around 08/25/2022) for BP.     GAlvira Monday FNP

## 2022-08-11 NOTE — Assessment & Plan Note (Addendum)
Physical exam as documented Counseling is done on healthy lifestyle involving commitment to 150 minutes of exercise per week,  Discussed heart-healthy diet  He reports getting colon cancer screening January as he is too busy now taking care of his mother He declines all vaccinations at this time

## 2022-08-11 NOTE — Patient Instructions (Signed)
I appreciate the opportunity to provide care to you today!    Follow up:  2 weeks for BP  Labs: next visit     Please continue to a heart-healthy diet and increase your physical activities. Try to exercise for at least three times a week.      It was a pleasure to see you and I look forward to continuing to work together on your health and well-being. Please do not hesitate to call the office if you need care or have questions about your care.   Have a wonderful day and week. With Gratitude, Gilmore Laroche MSN, FNP-BC

## 2022-08-11 NOTE — Assessment & Plan Note (Signed)
Uncontrolled We will follow-up on blood pressure in 2 weeks

## 2022-08-31 ENCOUNTER — Ambulatory Visit: Payer: Medicare HMO | Admitting: Family Medicine

## 2022-09-11 ENCOUNTER — Other Ambulatory Visit: Payer: Self-pay

## 2022-09-11 ENCOUNTER — Emergency Department (HOSPITAL_COMMUNITY)
Admission: EM | Admit: 2022-09-11 | Discharge: 2022-09-12 | Disposition: A | Discharge status: Home / Self Care | Payer: Medicare HMO | Attending: Emergency Medicine | Admitting: Emergency Medicine

## 2022-09-11 ENCOUNTER — Encounter (HOSPITAL_COMMUNITY): Payer: Self-pay | Admitting: Emergency Medicine

## 2022-09-11 DIAGNOSIS — R42 Dizziness and giddiness: Secondary | ICD-10-CM | POA: Insufficient documentation

## 2022-09-11 DIAGNOSIS — I10 Essential (primary) hypertension: Secondary | ICD-10-CM | POA: Diagnosis not present

## 2022-09-11 DIAGNOSIS — Z79899 Other long term (current) drug therapy: Secondary | ICD-10-CM | POA: Diagnosis not present

## 2022-09-11 DIAGNOSIS — W19XXXA Unspecified fall, initial encounter: Secondary | ICD-10-CM | POA: Diagnosis not present

## 2022-09-11 DIAGNOSIS — E119 Type 2 diabetes mellitus without complications: Secondary | ICD-10-CM | POA: Insufficient documentation

## 2022-09-11 DIAGNOSIS — Z7982 Long term (current) use of aspirin: Secondary | ICD-10-CM | POA: Insufficient documentation

## 2022-09-11 DIAGNOSIS — U071 COVID-19: Secondary | ICD-10-CM | POA: Diagnosis not present

## 2022-09-11 DIAGNOSIS — R55 Syncope and collapse: Secondary | ICD-10-CM | POA: Diagnosis not present

## 2022-09-11 DIAGNOSIS — R059 Cough, unspecified: Secondary | ICD-10-CM | POA: Diagnosis present

## 2022-09-11 DIAGNOSIS — R509 Fever, unspecified: Secondary | ICD-10-CM | POA: Diagnosis not present

## 2022-09-11 LAB — CBC
HCT: 38.1 % — ABNORMAL LOW (ref 39.0–52.0)
Hemoglobin: 12.8 g/dL — ABNORMAL LOW (ref 13.0–17.0)
MCH: 30 pg (ref 26.0–34.0)
MCHC: 33.6 g/dL (ref 30.0–36.0)
MCV: 89.4 fL (ref 80.0–100.0)
Platelets: 222 10*3/uL (ref 150–400)
RBC: 4.26 MIL/uL (ref 4.22–5.81)
RDW: 13.1 % (ref 11.5–15.5)
WBC: 4.6 10*3/uL (ref 4.0–10.5)
nRBC: 0 % (ref 0.0–0.2)

## 2022-09-11 LAB — BASIC METABOLIC PANEL
Anion gap: 9 (ref 5–15)
BUN: 18 mg/dL (ref 8–23)
CO2: 23 mmol/L (ref 22–32)
Calcium: 8.4 mg/dL — ABNORMAL LOW (ref 8.9–10.3)
Chloride: 104 mmol/L (ref 98–111)
Creatinine, Ser: 1.45 mg/dL — ABNORMAL HIGH (ref 0.61–1.24)
GFR, Estimated: 52 mL/min — ABNORMAL LOW (ref >60)
Glucose, Bld: 113 mg/dL — ABNORMAL HIGH (ref 70–99)
Potassium: 3.6 mmol/L (ref 3.5–5.1)
Sodium: 136 mmol/L (ref 135–145)

## 2022-09-11 LAB — RESP PANEL BY RT-PCR (RSV, FLU A&B, COVID)  RVPGX2
Influenza A by PCR: NEGATIVE
Influenza B by PCR: NEGATIVE
Resp Syncytial Virus by PCR: NEGATIVE
SARS Coronavirus 2 by RT PCR: POSITIVE — AB

## 2022-09-11 LAB — GROUP A STREP BY PCR: Group A Strep by PCR: NOT DETECTED

## 2022-09-11 LAB — CBG MONITORING, ED: Glucose-Capillary: 109 mg/dL — ABNORMAL HIGH (ref 70–99)

## 2022-09-11 MED ORDER — SODIUM CHLORIDE 0.9 % IV BOLUS
500.0000 mL | Freq: Once | INTRAVENOUS | Status: AC
  Administered 2022-09-11: 500 mL via INTRAVENOUS

## 2022-09-11 NOTE — ED Provider Notes (Incomplete)
[facility] EMERGENCY DEPARTMENT Provider Note   CSN: [id number] Arrival date & time: 09/11/22  2219     History {Add pertinent medical, surgical, social history, OB history to HPI:1} Chief Complaint  Patient presents with   Dizziness    Norman Campbell is a 71 y.o. male.  HPI     This is a 71 year old male with history of vertigo who presents with generalized dizziness and weakness.  Patient states he started feeling bad today.  He states that he feels lightheaded when he stands.  He has a history of vertigo but denies any room spinning dizziness.  He states that the lightheadedness has made him fall.  He denies hitting his head or loss of consciousness.  Denies any pain after falls.  Has had some sore throat.  Recently developed a cough.  No noted fevers but is temperature here was 100.0.  Home Medications Prior to Admission medications   Medication Sig Start Date End Date Taking? Authorizing Provider  acetaminophen (TYLENOL) 500 MG tablet Take 500 mg by mouth every 6 (six) hours as needed.    [provider]  amLODipine (NORVASC) 5 MG tablet Take 2 tablets by mouth once daily 07/19/22   Gilmore Laroche, FNP  aspirin 81 MG tablet 81 mg.    [provider]  Chromium Picolinate (CHROMIUM PICOLATE PO) Take by mouth.    [provider]  Ensure (ENSURE) Take 237 mLs by mouth 3 (three) times daily with meals. 09/15/21   P[place], Baird Kay, FNP  lovastatin (MEVACOR) 40 MG tablet Take 1 tablet (40 mg total) by mouth at bedtime. 08/11/22   Gilmore Laroche, FNP  meclizine (ANTIVERT) 25 MG tablet TAKE 1 TABLET BY MOUTH THREE TIMES DAILY AS NEEDED FOR DIZZINESS 09/08/21   Heather Roberts, NP  olmesartan (BENICAR) 20 MG tablet Take 1 tablet by mouth once daily 07/19/22   Gilmore Laroche, FNP      Allergies    Patient has no known allergies.    Review of Systems   Review of Systems  Constitutional:  Negative for fever.  HENT:  Positive for sore throat.    Respiratory:  Positive for cough.   Neurological:  Positive for light-headedness.  All other systems reviewed and are negative.   Physical Exam Updated Vital Signs BP (!) 154/77   Pulse (!) 104   Temp 100 F (37.8 C) (Oral)   Resp 18   Ht 1.88 m (6\' 2" )   Wt 72.6 kg   SpO2 98%   BMI 20.54 kg/m  Physical Exam Vitals and nursing note reviewed.  Constitutional:      Appearance: He is well-developed. He is not ill-appearing.     Comments: Elderly, non-ill-appearing  HENT:     Head: Normocephalic and atraumatic.     Nose: Nose normal.     Mouth/Throat:     Mouth: Mucous membranes are dry.  Eyes:     Extraocular Movements: Extraocular movements intact.     Pupils: Pupils are equal, round, and reactive to light.  Cardiovascular:     Rate and Rhythm: Normal rate and regular rhythm.     Heart sounds: Normal heart sounds. No murmur heard. Pulmonary:     Effort: Pulmonary effort is normal. No respiratory distress.     Breath sounds: Normal breath sounds. No wheezing.  Abdominal:     Palpations: Abdomen is soft.     Tenderness: There is no abdominal tenderness.  Musculoskeletal:     Cervical back: Neck  supple.  Lymphadenopathy:     Cervical: No cervical adenopathy.  Skin:    General: Skin is warm and dry.  Neurological:     Mental Status: He is alert and oriented to person, place, and time.     Comments: Cranial nerves II through XII intact, 5 out of 5 strength in all 4 extremities, no dysmetria to finger-nose-finger  Psychiatric:        Mood and Affect: Mood normal.     ED Results / Procedures / Treatments   Labs (all labs ordered are listed, but only abnormal results are displayed) Labs Reviewed  RESP PANEL BY RT-PCR (RSV, FLU A&B, COVID)  RVPGX2 - Abnormal; Notable for the following components:      Result Value   SARS Coronavirus 2 by RT PCR POSITIVE (*)    All other components within normal limits  CBC - Abnormal; Notable for the following components:    Hemoglobin 12.8 (*)    HCT 38.1 (*)    All other components within normal limits  CBG MONITORING, ED - Abnormal; Notable for the following components:   Glucose-Capillary 109 (*)    All other components within normal limits  GROUP A STREP BY PCR  BASIC METABOLIC PANEL  URINALYSIS, ROUTINE W REFLEX MICROSCOPIC    EKG EKG Interpretation  Date/Time:  Saturday September 11 2022 22:39:20 EST Ventricular Rate:  98 PR Interval:  172 QRS Duration: 94 QT Interval:  336 QTC Calculation: 429 R Axis:   26 Text Interpretation: Sinus rhythm Anteroseptal infarct, old Confirmed by Vivi Barrack [phone]) on 09/11/2022 11:01:12 PM  Radiology No results found.  Procedures Procedures  {Document cardiac monitor, telemetry assessment procedure when appropriate:1}  Medications Ordered in ED Medications  sodium chloride 0.9 % bolus 500 mL (has no administration in time range)    ED Course/ Medical Decision Making/ A&P                           Medical Decision Making Amount and/or Complexity of Data Reviewed Labs: ordered.   ***  {Document critical care time when appropriate:1} {Document review of labs and clinical decision tools ie heart score, Chads2Vasc2 etc:1}  {Document your independent review of radiology images, and any outside records:1} {Document your discussion with family members, caretakers, and with consultants:1} {Document social determinants of health affecting pt's care:1} {Document your decision making why or why not admission, treatments were needed:1} Final Clinical Impression(s) / ED Diagnoses Final diagnoses:  None    Rx / DC Orders ED Discharge Orders     None

## 2022-09-11 NOTE — ED Triage Notes (Signed)
Pt c/o dizziness that started yesterday. Hx of vertigo. This evening Pt got up to go to bathroom felt dizzy and fell onto floor. Denies any pain or injury, did not hit head, no LOC. Also complains of sore throat for past couple days.

## 2022-09-12 MED ORDER — NIRMATRELVIR/RITONAVIR (PAXLOVID) TABLET (RENAL DOSING): 2.0000 | ORAL_TABLET | Freq: Two times a day (BID) | ORAL | 0 refills | Status: DC

## 2022-09-12 NOTE — Discharge Instructions (Signed)
You were seen today for lightheadedness.  This is likely related to acute COVID-19 infection.  Make sure you are staying hydrated at home.  Take Paxlovid as instructed.  You should quarantine based on CDC recommendations.  Please see CDC website for details.  Tylenol for any body aches or fevers.

## 2022-09-15 ENCOUNTER — Telehealth: Payer: Self-pay | Admitting: *Deleted

## 2022-09-15 ENCOUNTER — Encounter: Payer: Self-pay | Admitting: *Deleted

## 2022-09-15 NOTE — Patient Outreach (Signed)
  Care Coordination TOC Note Red EMMI Alert Transition Care Management Follow-up Telephone Call Date of discharge and from where: [facility] 09/12/22 How have you been since you were released from the hospital? "Much better" Any questions or concerns? No. EMMI flag was for not having a follow up appointment, but he has one scheduled now for 09/17/22  Items Reviewed: Did the pt receive and understand the discharge instructions provided? Yes  Medications obtained and verified? Yes  Other? No  Any new allergies since your discharge? No  Dietary orders reviewed? Yes Do you have support at home? Yes   Home Care and Equipment/Supplies: Were home health services ordered? no If so, what is the name of the agency?   Has the agency set up a time to come to the patient's home? not applicable Were any new equipment or medical supplies ordered?  No What is the name of the medical supply agency?  Were you able to get the supplies/equipment? not applicable Do you have any questions related to the use of the equipment or supplies? No  Functional Questionnaire: (I = Independent and D = Dependent) ADLs: I  Bathing/Dressing- I  Meal Prep- I  Eating- I  Maintaining continence- I  Transferring/Ambulation- I  Managing Meds- I  Follow up appointments reviewed:  PCP Hospital f/u appt confirmed? Yes  Scheduled to see Alvira Monday, FNP on 09/17/22 at 10:00 [facility] f/u appt confirmed?  Not indicated   Are transportation arrangements needed? No  If their condition worsens, is the pt aware to call PCP or go to the Emergency Dept.? Yes Was the patient provided with contact information for the PCP's office or ED? Yes Was to pt encouraged to call back with questions or concerns? Yes  SDOH assessments and interventions completed:   Yes SDOH Interventions Today    Flowsheet Row Most Recent Value  SDOH Interventions   Transportation Interventions Intervention Not Indicated  Financial  Strain Interventions Intervention Not Indicated       Care Coordination Interventions:  No Care Coordination interventions needed at this time.   Encounter Outcome:  Pt. Visit Completed    Chong Sicilian, BSN, RN-BC RN Care Coordinator [place] Direct Dial: [phone] Main #: [phone]

## 2022-09-17 ENCOUNTER — Ambulatory Visit (INDEPENDENT_AMBULATORY_CARE_PROVIDER_SITE_OTHER): Payer: Medicare HMO | Admitting: Family Medicine

## 2022-09-17 ENCOUNTER — Encounter: Payer: Self-pay | Admitting: Family Medicine

## 2022-09-17 VITALS — BP 122/62 | HR 87 | Ht 74.0 in | Wt 175.1 lb

## 2022-09-17 DIAGNOSIS — F17210 Nicotine dependence, cigarettes, uncomplicated: Secondary | ICD-10-CM | POA: Diagnosis not present

## 2022-09-17 DIAGNOSIS — M25531 Pain in right wrist: Secondary | ICD-10-CM

## 2022-09-17 DIAGNOSIS — M25532 Pain in left wrist: Secondary | ICD-10-CM | POA: Diagnosis not present

## 2022-09-17 DIAGNOSIS — I10 Essential (primary) hypertension: Secondary | ICD-10-CM

## 2022-09-17 DIAGNOSIS — E119 Type 2 diabetes mellitus without complications: Secondary | ICD-10-CM | POA: Diagnosis not present

## 2022-09-17 DIAGNOSIS — E785 Hyperlipidemia, unspecified: Secondary | ICD-10-CM

## 2022-09-17 DIAGNOSIS — U071 COVID-19: Secondary | ICD-10-CM | POA: Diagnosis not present

## 2022-09-17 DIAGNOSIS — Z1211 Encounter for screening for malignant neoplasm of colon: Secondary | ICD-10-CM | POA: Diagnosis not present

## 2022-09-17 NOTE — Assessment & Plan Note (Signed)
Patient reports doing well He has completed therapy with Paxlovid Will assess BMP today for kidney function

## 2022-09-17 NOTE — Assessment & Plan Note (Signed)
Chronic bilateral wrist pain He has been implementing conservative management with minimal relief of his symptoms He reports an inability to tie his shoes and open a cane of soda Decreased bilateral hand grip Referral placed to orthopedic surgery

## 2022-09-17 NOTE — Progress Notes (Signed)
Established Patient Office Visit  Subjective:  Patient ID: Norman Campbell, male    DOB: [birth year]  Age: 72 y.o. MRN: [id number]  CC:  Chief Complaint  Patient presents with   Follow-up    Pt following up from ED visit on 09/11/2022, still feeling a little lightheaded.    Hand Pain    Pt reports hand pain on both hands this has been ongoing for many years, flare up from the change of weather, has been doing as recommended on last visit, soaking in hot water, states this hasn't helped.     HPI Norman Campbell is a 72 y.o. male with past medical history of hypertension, bilateral wrist pain, and hyperlipidemia presents for ER follow-up.  COVID-19: He reports taking his last dose of Paxlovid on 09/11/2022.  He denies headaches, sore throat, trouble breathing, headaches, congestion.  He reports feeling well.  Past Medical History:  Diagnosis Date   Diabetes mellitus without complication (HCC)    Hyperlipidemia    Hypertension     History reviewed. No pertinent surgical history.  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 [address]  Tobacco Use   Smoking status: Every Day    Packs/day: 1.00    Years: 55.00    Total pack years: 55.00    Types: Cigarettes   Smokeless tobacco: Never   Tobacco comments:    Smokes 1/2 pack a day  Vaping Use   Vaping Use: Never used  Substance and Sexual Activity   Alcohol use: Not Currently    Alcohol/week: 0.0 standard drinks of alcohol   Drug use: Never   Sexual activity: Yes  Other Topics Concern   Not on file  Social History Narrative   Not on file   Social Determinants of Health   Financial Resource Strain: Low Risk  (09/15/2022)   Overall  Financial Resource Strain (CARDIA)    Difficulty of Paying Living Expenses: Not hard at all  Food Insecurity: No Food Insecurity (05/19/2021)   Hunger Vital Sign    Worried About Running Out of Food in the Last Year: Never true    Ran Out of Food in the Last Year: Never true  Transportation Needs: No Transportation Needs (09/15/2022)   PRAPARE - Administrator, Civil Service (Medical): No    Lack of Transportation (Non-Medical): No  Physical Activity: Inactive (05/19/2021)   Exercise Vital Sign    Days of Exercise per Week: 0 days    Minutes of Exercise per Session: 0 min  Stress: No Stress Concern Present (05/19/2021)   [employer] of Occupational Health - Occupational Stress Questionnaire    Feeling of Stress : Not at all  Social Connections: Moderately Integrated (05/19/2021)   Social Connection and Isolation Panel [NHANES]    Frequency of Communication with Friends and Family: More than three times a week    Frequency of Social Gatherings with Friends and Family: More than three times a week    Attends Religious Services: More than 4 times per year    Active Member of [employer] or Organizations: No    Attends Banker Meetings: Never  Marital Status: Married  Human resources officer Violence: Not At Risk (05/19/2021)   Humiliation, Afraid, Rape, and Kick questionnaire    Fear of Current or Ex-Partner: No    Emotionally Abused: No    Physically Abused: No    Sexually Abused: No    Outpatient Medications Prior to Visit  Medication Sig Dispense Refill   acetaminophen (TYLENOL) 500 MG tablet Take 500 mg by mouth every 6 (six) hours as needed.     amLODipine (NORVASC) 5 MG tablet Take 2 tablets by mouth once daily 90 tablet 0   aspirin 81 MG tablet 81 mg.     Chromium Picolinate (CHROMIUM PICOLATE PO) Take by mouth.     Ensure (ENSURE) Take 237 mLs by mouth 3 (three) times daily with meals. 237 mL 12   lovastatin (MEVACOR) 40 MG tablet Take 1 tablet (40 mg total) by  mouth at bedtime. 90 tablet 3   meclizine (ANTIVERT) 25 MG tablet TAKE 1 TABLET BY MOUTH THREE TIMES DAILY AS NEEDED FOR DIZZINESS 30 tablet 0   olmesartan (BENICAR) 20 MG tablet Take 1 tablet by mouth once daily 90 tablet 0   nirmatrelvir/ritonavir, renal dosing, (PAXLOVID) 10 x 150 MG & 10 x 100MG  TABS Take 2 tablets by mouth 2 (two) times daily for 5 days. Patient GFR is 52.  Take nirmatrelvir (150 mg) one tablet twice daily for 5 days and ritonavir (100 mg) one tablet twice daily for 5 days. 20 tablet 0   No facility-administered medications prior to visit.    No Known Allergies  ROS Review of Systems  Constitutional:  Negative for fatigue and fever.  Eyes:  Negative for visual disturbance.  Respiratory:  Negative for chest tightness and shortness of breath.   Cardiovascular:  Negative for chest pain and palpitations.  Musculoskeletal:        Bilateral wrist pain  Neurological:  Negative for dizziness and headaches.      Objective:    Physical Exam HENT:     Head: Normocephalic.     Right Ear: External ear normal.     Left Ear: External ear normal.     Nose: No congestion or rhinorrhea.     Mouth/Throat:     Mouth: Mucous membranes are moist.  Cardiovascular:     Rate and Rhythm: Regular rhythm.     Heart sounds: No murmur heard. Pulmonary:     Effort: No respiratory distress.     Breath sounds: Normal breath sounds.  Musculoskeletal:     Comments: Range of motion of the wrist is intact Patient is unable to perform to [place] test  Decreased grip strength bilaterally      Neurological:     Mental Status: He is alert.     BP 122/62 (BP Location: Left Arm)   Pulse 87   Ht 6\' 2"  (1.88 m)   Wt 175 lb 1.3 oz (79.4 kg)   SpO2 99%   BMI 22.48 kg/m  Wt Readings from Last 3 Encounters:  09/17/22 175 lb 1.3 oz (79.4 kg)  09/11/22 160 lb (72.6 kg)  08/11/22 180 lb 1.9 oz (81.7 kg)    Lab Results  Component Value Date   TSH 2.250 09/15/2021   Lab Results   Component Value Date   WBC 4.6 09/11/2022   HGB 12.8 (L) 09/11/2022   HCT 38.1 (L) 09/11/2022   MCV 89.4 09/11/2022   PLT 222 09/11/2022   Lab Results  Component Value Date   NA 136 09/11/2022  K 3.6 09/11/2022   CO2 23 09/11/2022   GLUCOSE 113 (H) 09/11/2022   BUN 18 09/11/2022   CREATININE 1.45 (H) 09/11/2022   BILITOT 0.5 01/05/2021   ALKPHOS 95 01/05/2021   AST 16 01/05/2021   ALT 12 01/05/2021   PROT 7.8 01/05/2021   ALBUMIN 4.1 01/05/2021   CALCIUM 8.4 (L) 09/11/2022   ANIONGAP 9 09/11/2022   EGFR 56 (L) 01/05/2021   Lab Results  Component Value Date   CHOL 209 (H) 01/05/2021   Lab Results  Component Value Date   HDL 40 01/05/2021   Lab Results  Component Value Date   LDLCALC 151 (H) 01/05/2021   Lab Results  Component Value Date   TRIG 102 01/05/2021   No results found for: "CHOLHDL" Lab Results  Component Value Date   HGBA1C 6.2 (H) 01/05/2021      Assessment & Plan:  Bilateral wrist pain Assessment & Plan: Chronic bilateral wrist pain He has been implementing conservative management with minimal relief of his symptoms He reports an inability to tie his shoes and open a cane of soda Decreased bilateral hand grip Referral placed to orthopedic surgery   COVID-19 Assessment & Plan: Patient reports doing well He has completed therapy with Paxlovid Will assess BMP today for kidney function    Hypertension, unspecified type -     Basic metabolic panel  Hyperlipidemia, unspecified hyperlipidemia type  Cigarette nicotine dependence without complication -     CT CHEST LUNG CANCER SCREENING LOW DOSE WO CONTRAST  Colon cancer screening -     Ambulatory referral to Gastroenterology  Pain in both wrists -     Ambulatory referral to Orthopedic Surgery    Follow-up: Return in about 3 months (around 12/17/2022).   Alvira Monday, FNP

## 2022-09-17 NOTE — Patient Instructions (Addendum)
I appreciate the opportunity to provide care to you today!    Follow up:  3 months  Labs: please stop by the lab today to get your blood drawn (BMP)                     Please schedule Medicare Annual Wellness visit today  Referrals today- orthopedic surgery   Please continue to a heart-healthy diet and increase your physical activities. Try to exercise for 36mins at least three times a week.      It was a pleasure to see you and I look forward to continuing to work together on your health and well-being. Please do not hesitate to call the office if you need care or have questions about your care.   Have a wonderful day and week. With Gratitude, Alvira Monday MSN, FNP-BC

## 2022-09-18 LAB — BASIC METABOLIC PANEL
BUN/Creatinine Ratio: 11 (ref 10–24)
BUN: 13 mg/dL (ref 8–27)
CO2: 24 mmol/L (ref 20–29)
Calcium: 9.3 mg/dL (ref 8.6–10.2)
Chloride: 103 mmol/L (ref 96–106)
Creatinine, Ser: 1.16 mg/dL (ref 0.76–1.27)
Glucose: 92 mg/dL (ref 70–99)
Potassium: 4.2 mmol/L (ref 3.5–5.2)
Sodium: 142 mmol/L (ref 134–144)
eGFR: 68 mL/min/{1.73_m2} (ref >59)

## 2022-09-20 NOTE — Progress Notes (Signed)
Please inform the patient that his kidney and potassium are stable.

## 2022-09-24 ENCOUNTER — Encounter: Payer: Self-pay | Admitting: *Deleted

## 2022-10-04 ENCOUNTER — Ambulatory Visit (INDEPENDENT_AMBULATORY_CARE_PROVIDER_SITE_OTHER): Payer: Medicare HMO | Admitting: Internal Medicine

## 2022-10-04 ENCOUNTER — Encounter: Payer: Self-pay | Admitting: Internal Medicine

## 2022-10-04 VITALS — BP 155/77 | HR 93 | Resp 16 | Ht 74.0 in | Wt 176.8 lb

## 2022-10-04 DIAGNOSIS — Z0000 Encounter for general adult medical examination without abnormal findings: Secondary | ICD-10-CM

## 2022-10-04 NOTE — Progress Notes (Signed)
Subjective:   Norman Campbell is a 72 y.o. male who presents for Medicare Annual/Subsequent preventive examination.  Review of Systems    Review of Systems  All other systems reviewed and are negative.   Objective:    Today's Vitals   10/04/22 0955 10/04/22 0958  BP: (!) 155/77   Pulse: 93   Resp: 16   SpO2: 100%   Weight: 176 lb 12.8 oz (80.2 kg)   Height: 6\' 2"  (1.88 m)   PainSc:  0-No pain   Body mass index is 22.7 kg/m.     09/11/2022   10:30 PM 05/19/2021    2:44 PM 11/03/2020   11:15 AM  Advanced Directives  Does Patient Have a Medical Advance Directive? No No No  Would patient like information on creating a medical advance directive? No - Patient declined      Current Medications (verified) Outpatient Encounter Medications as of 10/04/2022  Medication Sig   acetaminophen (TYLENOL) 500 MG tablet Take 500 mg by mouth every 6 (six) hours as needed.   amLODipine (NORVASC) 5 MG tablet Take 2 tablets by mouth once daily   aspirin 81 MG tablet 81 mg.   Chromium Picolinate (CHROMIUM PICOLATE PO) Take by mouth.   Ensure (ENSURE) Take 237 mLs by mouth 3 (three) times daily with meals.   lovastatin (MEVACOR) 40 MG tablet Take 1 tablet (40 mg total) by mouth at bedtime.   meclizine (ANTIVERT) 25 MG tablet TAKE 1 TABLET BY MOUTH THREE TIMES DAILY AS NEEDED FOR DIZZINESS   olmesartan (BENICAR) 20 MG tablet Take 1 tablet by mouth once daily   No facility-administered encounter medications on file as of 10/04/2022.    Allergies (verified) Patient has no known allergies.   History: Past Medical History:  Diagnosis Date   Diabetes mellitus without complication ([place])    Hyperlipidemia    Hypertension    No past surgical history on file. 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 [employer]  Tobacco Use   Smoking status: Every Day    Packs/day: 1.00    Years: 55.00    Total pack years: 55.00    Types: Cigarettes   Smokeless tobacco: Never   Tobacco comments:    Smokes 1/2 pack a day  Vaping Use   Vaping Use: Never used  Substance and Sexual Activity   Alcohol use: Not Currently    Alcohol/week: 0.0 standard drinks of alcohol   Drug use: Never   Sexual activity: Yes  Other Topics Concern   Not on file  Social History Narrative   Not on file   Social Determinants of Health   Financial Resource Strain: Low Risk  (09/15/2022)   Overall Financial Resource Strain (CARDIA)    Difficulty of Paying Living Expenses: Not hard at all  Food Insecurity: No Food Insecurity (05/19/2021)   Hunger Vital Sign    Worried About Running Out of Food in the Last Year: Never true    [place] in the Last Year: Never true  Transportation Needs: No Transportation Needs (09/15/2022)   PRAPARE - Hydrologist (Medical): No  Lack of Transportation (Non-Medical): No  Physical Activity: Inactive (05/19/2021)   Exercise Vital Sign    Days of Exercise per Week: 0 days    Minutes of Exercise per Session: 0 min  Stress: No Stress Concern Present (05/19/2021)   [place]    Feeling of Stress : Not at all  Social Connections: Moderately Integrated (05/19/2021)   Social Connection and Isolation Panel [NHANES]    Frequency of Communication with Friends and Family: More than three times a week    Frequency of Social Gatherings with Friends and Family: More than three times a week    Attends Religious Services: More than 4 times per year    Active Member of [employer] or Organizations: No    Attends Music therapist: Never    Marital Status: Married    Tobacco Counseling Ready to quit: Not  Answered Counseling given: Not Answered Tobacco comments: Smokes 1/2 pack a day   Clinical Intake:  Pre-visit preparation completed: Yes  Pain : No/denies pain Pain Score: 0-No pain     Diabetes: No  How often do you need to have someone help you when you read instructions, pamphlets, or other written materials from your doctor or pharmacy?: 1 - Never What is the last grade level you completed in school?: 12th grade  Diabetic?Yes    Activities of Daily Living     No data to display          Patient Care Team: Alvira Monday, FNP as PCP - General (Family Medicine)  Indicate any recent Medical Services you may have received from other than [facility] providers in the past year (date may be approximate).     Assessment:   This is a routine wellness examination for Norman Campbell.  Hearing/Vision screen No results found.  Dietary issues and exercise activities discussed:     Goals Addressed   None    Depression Screen    10/04/2022   10:00 AM 09/17/2022    9:58 AM 08/11/2022    1:15 PM 07/29/2021    9:34 AM 05/19/2021    2:42 PM 01/05/2021    9:33 AM 12/22/2020    9:39 AM  PHQ 2/9 Scores  PHQ - 2 Score 0 0 0 0 0 0 0  PHQ- 9 Score  2 0        Fall Risk    10/04/2022   10:00 AM 09/17/2022    9:58 AM 08/11/2022    1:15 PM 07/29/2021    9:34 AM 05/19/2021    2:44 PM  Fall Risk   Falls in the past year? 1 1 0 0 1  Number falls in past yr: 0 1 0 0 0  Injury with Fall? 0 1 0 0 1  Risk for fall due to :  No Fall Risks No Fall Risks No Fall Risks No Fall Risks  Follow up  Falls evaluation completed Falls evaluation completed Falls evaluation completed Falls evaluation completed    [place]:  Any stairs in or around the home? No  If so, are there any without handrails? No  Home free of loose throw rugs in walkways, pet beds, electrical cords, etc? Yes  Adequate lighting in your home to reduce risk of falls? Yes   ASSISTIVE DEVICES  UTILIZED TO PREVENT FALLS:  Life alert? No  Use of a cane, walker or w/c? No  Grab bars in the bathroom? No  Shower chair or bench in shower? No  Elevated toilet seat or a handicapped toilet? Yes   Cognitive Function:        10/04/2022   10:00 AM 05/19/2021    2:45 PM  6CIT Screen  What Year? 0 points 0 points  What month? 0 points 0 points  What time? 0 points 0 points  Count back from 20 0 points 0 points  Months in reverse 0 points 0 points  Repeat phrase 0 points 0 points  Total Score 0 points 0 points    Immunizations Immunization History  Administered Date(s) Administered   Moderna Sars-Covid-2 Vaccination 11/13/2019, 12/11/2019    TDAP status: Due, Education has been provided regarding the importance of this vaccine. Advised may receive this vaccine at local pharmacy or Health Dept. Aware to provide a copy of the vaccination record if obtained from local pharmacy or Health Dept. Verbalized acceptance and understanding.  Flu Vaccine status: Declined, Education has been provided regarding the importance of this vaccine but patient still declined. Advised may receive this vaccine at local pharmacy or Health Dept. Aware to provide a copy of the vaccination record if obtained from local pharmacy or Health Dept. Verbalized acceptance and understanding.  Pneumococcal vaccine status: Declined,  Education has been provided regarding the importance of this vaccine but patient still declined. Advised may receive this vaccine at local pharmacy or Health Dept. Aware to provide a copy of the vaccination record if obtained from local pharmacy or Health Dept. Verbalized acceptance and understanding.   Covid-19 vaccine status: Information provided on how to obtain vaccines.   Qualifies for Shingles Vaccine? Yes   Zostavax completed No   Shingrix Completed?: No.    Education has been provided regarding the importance of this vaccine. Patient has been advised to call insurance company to  determine out of pocket expense if they have not yet received this vaccine. Advised may also receive vaccine at local pharmacy or Health Dept. Verbalized acceptance and understanding.  Screening Tests Health Maintenance  Topic Date Due   DTaP/Tdap/Td (1 - Tdap) Never done   COLONOSCOPY (Pts 45-12yrs Insurance coverage will need to be confirmed)  Never done   Lung Cancer Screening  Never done   COVID-19 Vaccine (3 - 2023-24 season) 05/14/2022   Medicare Annual Wellness (AWV)  05/19/2022   INFLUENZA VACCINE  12/12/2022 (Originally 04/13/2022)   Zoster Vaccines- Shingrix (1 of 2) 12/17/2022 (Originally 11/28/2002)   Pneumonia Vaccine 53+ Years old (1 - PCV) 08/12/2023 (Originally 11/28/1956)   Hepatitis C Screening  Completed   HPV VACCINES  Aged Out    Health Maintenance  Health Maintenance Due  Topic Date Due   DTaP/Tdap/Td (1 - Tdap) Never done   COLONOSCOPY (Pts 45-65yrs Insurance coverage will need to be confirmed)  Never done   Lung Cancer Screening  Never done   COVID-19 Vaccine (3 - 2023-24 season) 05/14/2022   Medicare Annual Wellness (AWV)  05/19/2022    Colorectal cancer screening: Referral to GI placed for next month . Pt aware the office will call re: appt.  Lung Cancer Screening: (Low Dose CT Chest recommended if Age 68-80 years, 30 pack-year currently smoking OR have quit w/in 15years.) does qualify.   Lung Cancer Screening Referral: It was placed for 10/12/2022  Additional Screening:  Hepatitis C Screening: does not qualify; Completed 4/205/2022  Vision Screening: Recommended annual ophthalmology exams for early detection of glaucoma and other disorders of the eye. Is the patient up to date with their  annual eye exam?  Yes  Who is the provider or what is the name of the office in which the patient attends annual eye exams? [facility] If pt is not established with a provider, would they like to be referred to a provider to establish care? No .   Dental Screening:  Recommended annual dental exams for proper oral hygiene  Community Resource Referral / Chronic Care Management: CRR required this visit?  No   CCM required this visit?  No      Plan:     I have personally reviewed and noted the following in the patient's chart:   Medical and social history Use of alcohol, tobacco or illicit drugs  Current medications and supplements including opioid prescriptions. Patient is not currently taking opioid prescriptions. Functional ability and status Nutritional status Physical activity Advanced directives List of other physicians Hospitalizations, surgeries, and ER visits in previous 12 months Vitals Screenings to include cognitive, depression, and falls Referrals and appointments  In addition, I have reviewed and discussed with patient certain preventive protocols, quality metrics, and best practice recommendations. A written personalized care plan for preventive services as well as general preventive health recommendations were provided to patient.     Milus Banister, MD   10/04/2022

## 2022-10-04 NOTE — Patient Instructions (Signed)
  Norman Campbell , Thank you for taking time to come for your Medicare Wellness Visit. I appreciate your ongoing commitment to your health goals. Please review the following plan we discussed and let me know if I can assist you in the future.   These are the goals we discussed: Continue staying active so he can maintain health.   This is a list of the screening recommended for you and due dates:  Health Maintenance  Topic Date Due   DTaP/Tdap/Td vaccine (1 - Tdap) Never done   Colon Cancer Screening  Never done   Screening for Lung Cancer  Never done   COVID-19 Vaccine (3 - 2023-24 season) 05/14/2022   Medicare Annual Wellness Visit  05/19/2022   Flu Shot  12/12/2022*   Zoster (Shingles) Vaccine (1 of 2) 12/17/2022*   Pneumonia Vaccine (1 - PCV) 08/12/2023*   Hepatitis C Screening: USPSTF Recommendation to screen - Ages 97-79 yo.  Completed   HPV Vaccine  Aged Out  *Topic was postponed. The date shown is not the original due date.

## 2022-10-06 ENCOUNTER — Encounter: Payer: Medicare HMO | Admitting: Orthopedic Surgery

## 2022-10-12 ENCOUNTER — Ambulatory Visit (HOSPITAL_COMMUNITY): Admission: RE | Admit: 2022-10-12 | Payer: Medicare HMO | Source: Ambulatory Visit

## 2022-10-20 ENCOUNTER — Other Ambulatory Visit: Payer: Self-pay | Admitting: Family Medicine

## 2022-12-17 ENCOUNTER — Encounter: Payer: Self-pay | Admitting: Family Medicine

## 2022-12-17 ENCOUNTER — Ambulatory Visit: Payer: Medicare HMO | Admitting: Family Medicine

## 2023-01-28 ENCOUNTER — Other Ambulatory Visit: Payer: Self-pay | Admitting: Family Medicine

## 2023-01-31 ENCOUNTER — Other Ambulatory Visit: Payer: Self-pay | Admitting: Family Medicine

## 2023-03-15 ENCOUNTER — Encounter: Payer: Self-pay | Admitting: *Deleted

## 2023-05-03 ENCOUNTER — Other Ambulatory Visit: Payer: Self-pay | Admitting: Family Medicine

## 2023-06-17 ENCOUNTER — Other Ambulatory Visit: Payer: Self-pay | Admitting: Family Medicine

## 2023-06-17 DIAGNOSIS — Z1212 Encounter for screening for malignant neoplasm of rectum: Secondary | ICD-10-CM

## 2023-06-17 DIAGNOSIS — Z1211 Encounter for screening for malignant neoplasm of colon: Secondary | ICD-10-CM

## 2023-07-29 ENCOUNTER — Other Ambulatory Visit: Payer: Self-pay | Admitting: Family Medicine

## 2023-10-11 ENCOUNTER — Other Ambulatory Visit: Payer: Self-pay | Admitting: Family Medicine

## 2023-10-11 ENCOUNTER — Ambulatory Visit (INDEPENDENT_AMBULATORY_CARE_PROVIDER_SITE_OTHER): Payer: Medicare HMO

## 2023-10-11 VITALS — Ht 74.0 in | Wt 176.0 lb

## 2023-10-11 DIAGNOSIS — Z1211 Encounter for screening for malignant neoplasm of colon: Secondary | ICD-10-CM

## 2023-10-11 DIAGNOSIS — F17210 Nicotine dependence, cigarettes, uncomplicated: Secondary | ICD-10-CM

## 2023-10-11 DIAGNOSIS — Z2821 Immunization not carried out because of patient refusal: Secondary | ICD-10-CM

## 2023-10-11 DIAGNOSIS — Z0000 Encounter for general adult medical examination without abnormal findings: Secondary | ICD-10-CM

## 2023-10-11 DIAGNOSIS — Z87891 Personal history of nicotine dependence: Secondary | ICD-10-CM

## 2023-10-11 NOTE — Progress Notes (Signed)
Please attest and cosign this visit due to patients primary care provider not being in the office at the time the visit was completed.   Because this visit was a virtual/telehealth visit,  certain criteria was not obtained, such a blood pressure, CBG if applicable, and timed get up and go. Any medications not marked as "taking" were not mentioned during the medication reconciliation part of the visit. Any vitals not documented were not able to be obtained due to this being a telehealth visit or patient was unable to self-report a recent blood pressure reading due to a lack of equipment at home via telehealth. Vitals that have been documented are verbally provided by the patient.  Interactive audio and video telecommunications were attempted between this provider and patient, however failed, due to patient having technical difficulties OR patient did not have access to video capability.  We continued and completed visit with audio only.  Subjective:   Norman Campbell is a 73 y.o. male who presents for Medicare Annual/Subsequent preventive examination.  Visit Complete: Virtual I connected with  Norman Campbell on 10/11/23 by a audio enabled telemedicine application and verified that I am speaking with the correct person using two identifiers.  Patient Location: Home  Provider Location: Home Office  I discussed the limitations of evaluation and management by telemedicine. The patient expressed understanding and agreed to proceed.  Vital Signs: Because this visit was a virtual/telehealth visit, some criteria may be missing or patient reported. Any vitals not documented were not able to be obtained and vitals that have been documented are patient reported.  Patient Medicare AWV questionnaire was completed by the patient on na; I have confirmed that all information answered by patient is correct and no changes since this date.  Cardiac Risk Factors include: advanced age (>24men, >25  women);diabetes mellitus;dyslipidemia;hypertension;male gender;sedentary lifestyle;smoking/ tobacco exposure     Objective:    Today's Vitals   10/11/23 0923  Weight: 176 lb (79.8 kg)  Height: 6\' 2"  (1.88 m)   Body mass index is 22.6 kg/m.     10/11/2023    9:26 AM 09/11/2022   10:30 PM 05/19/2021    2:44 PM 11/03/2020   11:15 AM  Advanced Directives  Does Patient Have a Medical Advance Directive? No No No No  Would patient like information on creating a medical advance directive? No - Patient declined No - Patient declined      Current Medications (verified) Outpatient Encounter Medications as of 10/11/2023  Medication Sig   acetaminophen (TYLENOL) 500 MG tablet Take 500 mg by mouth every 6 (six) hours as needed.   amLODipine (NORVASC) 5 MG tablet Take 2 tablets by mouth once daily   aspirin 81 MG tablet 81 mg.   Chromium Picolinate (CHROMIUM PICOLATE PO) Take by mouth.   lovastatin (MEVACOR) 40 MG tablet Take 1 tablet (40 mg total) by mouth at bedtime.   meclizine (ANTIVERT) 25 MG tablet TAKE 1 TABLET BY MOUTH THREE TIMES DAILY AS NEEDED FOR DIZZINESS   olmesartan (BENICAR) 20 MG tablet Take 1 tablet by mouth once daily   Ensure (ENSURE) Take 237 mLs by mouth 3 (three) times daily with meals. (Patient not taking: Reported on 10/11/2023)   No facility-administered encounter medications on file as of 10/11/2023.    Allergies (verified) Patient has no known allergies.   History: Past Medical History:  Diagnosis Date   Diabetes mellitus without complication (HCC)    Hyperlipidemia    Hypertension    History reviewed.  No pertinent surgical history. 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 [address]  Tobacco Use   Smoking status: Every Day    Current packs/day: 1.00    Average packs/day: 1 pack/day for 55.0 years (55.0 ttl pk-yrs)    Types: Cigarettes   Smokeless tobacco: Never   Tobacco comments:    Smokes 1/2 pack a day  Vaping Use   Vaping status: Never Used  Substance and Sexual Activity   Alcohol use: Not Currently    Alcohol/week: 0.0 standard drinks of alcohol   Drug use: Never   Sexual activity: Yes  Other Topics Concern   Not on file  Social History Narrative   Not on file   Social Drivers of Health   Financial Resource Strain: Low Risk  (10/11/2023)   Overall Financial Resource Strain (CARDIA)    Difficulty of Paying Living Expenses: Not hard at all  Food Insecurity: No Food Insecurity (10/11/2023)   Hunger Vital Sign    Worried About Running Out of Food in the Last Year: Never true    Ran Out of Food in the Last Year: Never true  Transportation Needs: No Transportation Needs (10/11/2023)   PRAPARE - Administrator, Civil Service (Medical): No    Lack of Transportation (Non-Medical): No  Physical Activity: Inactive (10/11/2023)   Exercise Vital Sign    Days of Exercise per Week: 0 days    Minutes of Exercise per Session: 0 min  Stress: No Stress Concern Present (10/11/2023)   [employer] of Occupational Health - Occupational Stress Questionnaire    Feeling of Stress : Not at all  Social Connections: Moderately Isolated (10/11/2023)   Social Connection and Isolation Panel [NHANES]    Frequency of Communication with Friends and Family: More than three times a week    Frequency of Social Gatherings with Friends and Family: More than three times a week    Attends Religious Services: Never    Database administrator or Organizations: No    Attends Engineer, structural: Never    Marital Status: Married    Tobacco Counseling Ready to quit: No Counseling given: Yes Tobacco comments: Smokes 1/2 pack a day   Clinical  Intake:  Pre-visit preparation completed: Yes  Pain : No/denies pain     BMI - recorded: 22.6 Nutritional Status: BMI of 19-24  Normal Nutritional Risks: None Diabetes: Yes CBG done?: No Did pt. bring in CBG monitor from home?: No  How often do you need to have someone help you when you read instructions, pamphlets, or other written materials from your doctor or pharmacy?: 1 - Never  Interpreter Needed?: No  Information entered by :: Maryjean Ka CMA   Activities of Daily Living    10/11/2023    9:31 AM  In your present state of health, do you have any difficulty performing the following activities:  Hearing? 0  Vision? 0  Difficulty concentrating or making decisions? 0  Walking or climbing stairs? 0  Dressing or bathing? 0  Doing errands, shopping? 0  Preparing Food and eating ? N  Using the Toilet? N  In the  past six months, have you accidently leaked urine? N  Do you have problems with loss of bowel control? N  Managing your Medications? N  Managing your Finances? N  Housekeeping or managing your Housekeeping? N    Patient Care Team: Gilmore Laroche, FNP as PCP - General (Family Medicine)  Indicate any recent Medical Services you may have received from other than [facility] providers in the past year (date may be approximate).     Assessment:   This is a routine wellness examination for Stewart.  Hearing/Vision screen Hearing Screening - Comments:: Patient denies any hearing difficulties.   Vision Screening - Comments:: Wears rx glasses - Last saw Dr. Daisy Lazar but is not up to date with exams.   Goals Addressed             This Visit's Progress    Patient Stated       Remain active and healthy and live another year       Depression Screen    10/11/2023    9:28 AM 10/04/2022   10:00 AM 09/17/2022    9:58 AM 08/11/2022    1:15 PM 07/29/2021    9:34 AM 05/19/2021    2:42 PM 01/05/2021    9:33 AM  PHQ 2/9 Scores  PHQ - 2 Score 0 0 0 0 0 0 0  PHQ- 9 Score 0   2 0       Fall Risk    10/11/2023    9:27 AM 10/04/2022   10:00 AM 09/17/2022    9:58 AM 08/11/2022    1:15 PM 07/29/2021    9:34 AM  Fall Risk   Falls in the past year? 0 1 1 0 0  Number falls in past yr: 0 0 1 0 0  Injury with Fall? 0 0 1 0 0  Risk for fall due to : No Fall Risks  No Fall Risks No Fall Risks No Fall Risks  Follow up Falls prevention discussed  Falls evaluation completed Falls evaluation completed Falls evaluation completed    MEDICARE RISK AT HOME: Medicare Risk at Home Any stairs in or around the home?: No If so, are there any without handrails?: No Home free of loose throw rugs in walkways, pet beds, electrical cords, etc?: Yes Adequate lighting in your home to reduce risk of falls?: Yes Life alert?: No Use of a cane, walker or w/c?: No Grab bars in the bathroom?: No Shower chair or bench in shower?: No Elevated toilet seat or a handicapped toilet?: Yes  TIMED UP AND GO:  Was the test performed?  No    Cognitive Function:        10/11/2023    9:27 AM 10/04/2022   10:00 AM 05/19/2021    2:45 PM  6CIT Screen  What Year? 0 points 0 points 0 points  What month? 0 points 0 points 0 points  What time? 0 points 0 points 0 points  Count back from 20 0 points 0 points 0 points  Months in reverse 0 points 0 points 0 points  Repeat phrase 0 points 0 points 0 points  Total Score 0 points 0 points 0 points    Immunizations Immunization History  Administered Date(s) Administered   Moderna Sars-Covid-2 Vaccination 11/13/2019, 12/11/2019    TDAP Status: Patient declined vaccine Due, Education has been provided regarding the importance of this vaccine. Advised may receive this vaccine at local pharmacy or Health Dept. Aware to provide a copy of the vaccination  record if obtained from local pharmacy or Health Dept. Verbalized acceptance and understanding.   Flu Vaccine status: Declined, Education has been provided regarding the importance of this vaccine but  patient still declined. Advised may receive this vaccine at local pharmacy or Health Dept. Aware to provide a copy of the vaccination record if obtained from local pharmacy or Health Dept. Verbalized acceptance and understanding.  Pneumococcal vaccine status: Declined,  Education has been provided regarding the importance of this vaccine but patient still declined. Advised may receive this vaccine at local pharmacy or Health Dept. Aware to provide a copy of the vaccination record if obtained from local pharmacy or Health Dept. Verbalized acceptance and understanding.   Covid-19 vaccine status: Declined, Education has been provided regarding the importance of this vaccine but patient still declined. Advised may receive this vaccine at local pharmacy or Health Dept.or vaccine clinic. Aware to provide a copy of the vaccination record if obtained from local pharmacy or Health Dept. Verbalized acceptance and understanding.  Qualifies for Shingles Vaccine? Yes   Shingrix Vaccine: Patient declined vaccine.  Due, Education has been provided regarding the importance of this vaccine. Advised may receive this vaccine at local pharmacy or Health Dept. Aware to provide a copy of the vaccination record if obtained from local pharmacy or Health Dept. Verbalized acceptance and understanding.  Screening Tests Health Maintenance  Topic Date Due   Pneumonia Vaccine 2+ Years old (1 of 2 - PCV) Never done   DTaP/Tdap/Td (1 - Tdap) Never done   Colonoscopy  Never done   Lung Cancer Screening  Never done   Zoster Vaccines- Shingrix (1 of 2) Never done   INFLUENZA VACCINE  Never done   COVID-19 Vaccine (3 - 2024-25 season) 05/15/2023   Medicare Annual Wellness (AWV)  10/05/2023   Hepatitis C Screening  Completed   HPV VACCINES  Aged Out    Health Maintenance  Health Maintenance Due  Topic Date Due   Pneumonia Vaccine 54+ Years old (1 of 2 - PCV) Never done   DTaP/Tdap/Td (1 - Tdap) Never done   Colonoscopy   Never done   Lung Cancer Screening  Never done   Zoster Vaccines- Shingrix (1 of 2) Never done   INFLUENZA VACCINE  Never done   COVID-19 Vaccine (3 - 2024-25 season) 05/15/2023   Medicare Annual Wellness (AWV)  10/05/2023   Colorectal cancer screening: Cologuard order placed 10/11/2023   Lung Cancer Screening: (Low Dose CT Chest recommended if Age 51-80 years, 20 pack-year currently smoking OR have quit w/in 15years.) does qualify.   Lung Cancer Screening Referral: 1/28/20254  Additional Screening:  Hepatitis C Screening: does not qualify; Completed   Vision Screening: Recommended annual ophthalmology exams for early detection of glaucoma and other disorders of the eye. Is the patient up to date with their annual eye exam?  No  Who is the provider or what is the name of the office in which the patient attends annual eye exams? Last saw Dr. Daisy Lazar If pt is not established with a provider, would they like to be referred to a provider to establish care? No .   Dental Screening: Recommended annual dental exams for proper oral hygiene  Diabetic Foot Exam: na  Community Resource Referral / Chronic Care Management: CRR required this visit?  No   CCM required this visit?  No     Plan:     I have personally reviewed and noted the following in the patient's chart:  Medical and social history Use of alcohol, tobacco or illicit drugs  Current medications and supplements including opioid prescriptions. Patient is not currently taking opioid prescriptions. Functional ability and status Nutritional status Physical activity Advanced directives List of other physicians Hospitalizations, surgeries, and ER visits in previous 12 months Vitals Screenings to include cognitive, depression, and falls Referrals and appointments  In addition, I have reviewed and discussed with patient certain preventive protocols, quality metrics, and best practice recommendations. A written  personalized care plan for preventive services as well as general preventive health recommendations were provided to patient.     Jordan Hawks Miraya Cudney, CMA   10/11/2023   After Visit Summary: (Mail) Due to this being a telephonic visit, the after visit summary with patients personalized plan was offered to patient via mail   Nurse Notes: see routing comment

## 2023-10-11 NOTE — Patient Instructions (Signed)
[name] , Thank you for taking time to come for your Medicare Wellness Visit. I appreciate your ongoing commitment to your health goals. Please review the following plan we discussed and let me know if I can assist you in the future.   Referrals/Orders/Follow-Ups/Clinician Recommendations:  Next Medicare Annual Wellness Visit: October 15, 2024 at 10:40 am telephone visit.   You have been referred to have a low dose lung cancer screening test. If you haven't heard from them in a week please call one of the numbers listed below.   [facility] [address], [place], [place] [address] [place] Phone: [phone] [facility] Phone: [phone]   An order has been placed for a Cologuard for you. They will mail you the kit with instructions on how to obtain the sample and send it back in to be tested. If you do not received your kit, please call our office and let us know. The customer service phone number for the lab is [phone]. They can answer any questions you may have.  If your cologuard comes back positive, you will be referred to have a routine colonoscopy. If it comes back negative, you will not have to repeat the test for 3 years.   This is a list of the screening recommended for you and due dates:  Health Maintenance  Topic Date Due   Pneumonia Vaccine (1 of 2 - PCV) Never done   DTaP/Tdap/Td vaccine (1 - Tdap) Never done   Colon Cancer Screening  Never done   Screening for Lung Cancer  Never done   Zoster (Shingles) Vaccine (1 of 2) Never done   Flu Shot  Never done   COVID-19 Vaccine (3 - 2024-25 season) 05/15/2023   Medicare Annual Wellness Visit  10/10/2024   Hepatitis C Screening  Completed   HPV Vaccine  Aged Out    Advanced directives: (ACP Link)Information on Advanced Care Planning can be found at [facility] [place] Advance Health Care Directives Advance Health Care Directives ([email or web address]   Next Medicare Annual Wellness  Visit scheduled for next year: yes  Preventive Care 65 Years and Older, Male Preventive care refers to lifestyle choices and visits with your health care provider that can promote health and wellness. Preventive care visits are also called wellness exams. What can I expect for my preventive care visit? Counseling During your preventive care visit, your health care provider may ask about your: Medical history, including: Past medical problems. Family medical history. History of falls. Current health, including: Emotional well-being. Home life and relationship well-being. Sexual activity. Memory and ability to understand (cognition). Lifestyle, including: Alcohol, nicotine or tobacco, and drug use. Access to firearms. Diet, exercise, and sleep habits. Work and work Astronomer. Sunscreen use. Safety issues such as seatbelt and bike helmet use. Physical exam Your health care provider will check your: Height and weight. These may be used to calculate your BMI (body mass index). BMI is a measurement that tells if you are at a healthy weight. Waist circumference. This measures the distance around your waistline. This measurement also tells if you are at a healthy weight and may help predict your risk of certain diseases, such as type 2 diabetes and high blood pressure. Heart rate and blood pressure. Body temperature. Skin for abnormal spots. What immunizations do I need?  Vaccines are usually given at various ages, according to a schedule. Your health care provider will recommend vaccines for you based on your age, medical history, and  lifestyle or other factors, such as travel or where you work. What tests do I need? Screening Your health care provider may recommend screening tests for certain conditions. This may include: Lipid and cholesterol levels. Diabetes screening. This is done by checking your blood sugar (glucose) after you have not eaten for a while (fasting). Hepatitis C  test. Hepatitis B test. HIV (human immunodeficiency virus) test. STI (sexually transmitted infection) testing, if you are at risk. Lung cancer screening. Colorectal cancer screening. Prostate cancer screening. Abdominal aortic aneurysm (AAA) screening. You may need this if you are a current or former smoker. Talk with your health care provider about your test results, treatment options, and if necessary, the need for more tests. Follow these instructions at home: Eating and drinking  Eat a diet that includes fresh fruits and vegetables, whole grains, lean protein, and low-fat dairy products. Limit your intake of foods with high amounts of sugar, saturated fats, and salt. Take vitamin and mineral supplements as recommended by your health care provider. Do not drink alcohol if your health care provider tells you not to drink. If you drink alcohol: Limit how much you have to 0-2 drinks a day. Know how much alcohol is in your drink. In the [place] one drink equals one 12 oz bottle of beer (355 mL), one 5 oz glass of wine (148 mL), or one 1 oz glass of hard liquor (44 mL). Lifestyle Brush your teeth every morning and night with fluoride toothpaste. Floss one time each day. Exercise for at least 30 minutes 5 or more days each week. Do not use any products that contain nicotine or tobacco. These products include cigarettes, chewing tobacco, and vaping devices, such as e-cigarettes. If you need help quitting, ask your health care provider. Do not use drugs. If you are sexually active, practice safe sex. Use a condom or other form of protection to prevent STIs. Take aspirin only as told by your health care provider. Make sure that you understand how much to take and what form to take. Work with your health care provider to find out whether it is safe and beneficial for you to take aspirin daily. Ask your health care provider if you need to take a cholesterol-lowering medicine (statin). Find healthy  ways to manage stress, such as: Meditation, yoga, or listening to music. Journaling. Talking to a trusted person. Spending time with friends and family. Safety Always wear your seat belt while driving or riding in a vehicle. Do not drive: If you have been drinking alcohol. Do not ride with someone who has been drinking. When you are tired or distracted. While texting. If you have been using any mind-altering substances or drugs. Wear a helmet and other protective equipment during sports activities. If you have firearms in your house, make sure you follow all gun safety procedures. Minimize exposure to UV radiation to reduce your risk of skin cancer. What's next? Visit your health care provider once a year for an annual wellness visit. Ask your health care provider how often you should have your eyes and teeth checked. Stay up to date on all vaccines. This information is not intended to replace advice given to you by your health care provider. Make sure you discuss any questions you have with your health care provider. Document Revised: 02/25/2021 Document Reviewed: 02/25/2021 Elsevier Patient Education  2024 Elsevier Inc.Stool DNA Testing for Colon Cancer Why am I having this test? Colon cancer is one of the most common types of cancer and  a leading cause of cancer-related death. Testing for colon cancer before symptoms develop (screening) can help to find the cancer early, which leads to a better chance for effective treatment. Stool DNA testing, also called the fecal immunochemical DNA test (FIT-DNA test), is one method of screening for colon cancer. All adults should have colon cancer screening starting at age 65 and continuing until age 41. After age 49, you may no longer need screening based on your health. Your health care provider may also recommend screening before age 38. You will have tests every 1-10 years, depending on your results and the type of screening test. Screening may  include having stool DNA testing every 3 years if: You have no symptoms of colon cancer. Symptoms include rectal bleeding, unexplained weight loss, and changes in bowel habits. You have an average risk of colon cancer. Average risk means: You do not have precancerous polyps. These are abnormal growths that can become cancerous (malignant). You do not have family or personal history of either colon cancer or a colon disease that increases your risk for colon cancer. You do not have a personal history of other types of cancer. You do not have diabetes. This test may be done more often for people at high risk. What is being tested? For the test, a sample of the stool is checked for blood and changes in DNA that could lead to cancer. Growths in the colon bleed and shed cells if they are cancerous or precancerous. Blood and cells can be picked up by stool as it passes through the colon. This test checks for blood cells as well as nine types of DNA (biomarkers) in three genes that have been linked to colon cancer and precancerous polyps. What kind of sample is taken?  This test uses a stool sample that is collected when you have a bowel movement. How do I collect samples at home? You will be sent a stool sample collection kit in the mail. The kit will include instructions and everything you need to get the sample. When collecting a stool (feces) sample at home, make sure you: Use supplies and instructions that you received from the lab. Have a bowel movement directly into a clean, dry container. Do not collect stool from the water in the toilet. Transfer the sample into the germ-free (sterile) cup that you received from the lab. Do not let any toilet paper or urine get into the cup. Write your information on the label of the cup. Use ink that will not smear. To do this: Write your full legal name. Do not write a nickname. Write your date of birth. Write the date and time that you collected the  sample. Return the sample to the lab as told. The sample will be checked within about 3 days of when the lab received it. The results will be sent to your health care provider. How do I prepare for this test? There is no preparation needed for this test. However, do not collect a stool sample if: You have bleeding hemorrhoids. These are swollen veins in and around the rectum or the opening between the buttocks (anus). You have any rectal bleeding. You have a cut on your hand or finger. You have your menstrual period. You have diarrhea. How are the results reported? Your test results will be reported as either positive or negative. Sometimes, the test results may report that a condition is present when it is not present (false-positive result). Sometimes, the test results may report  that a condition is not present when it is present (false-negative result). What do the results mean? A positive result means that the test found abnormal DNA or blood cells, or both. If you have a positive result, you will need to have a follow-up exam of your colon done with a scope (colonoscopy). A negative result means that no blood or changes in DNA were found. This does not guarantee that you do not have colon cancer. Your health care provider may recommend that you have other screening tests. Talk with your health care provider about what your results mean. Ask if further testing needs to be done and ask about the timing of those tests. Questions to ask your health care provider Ask your health care provider, or the department that is doing the test: When will my results be ready? How will I get my results? What are my treatment options? What other tests do I need? What are my next steps? Summary Stool DNA testing, also called the FIT-DNA test, is one method of screening for colon cancer. For the test, a sample of the stool (feces) is checked for blood and changes in DNA that could lead to cancer. Do not  collect astool sample if you have bleeding hemorrhoids, rectal bleeding, a cut on your hand or finger, your menstrual period, or diarrhea. Your test results will be reported as either positive or negative. This information is not intended to replace advice given to you by your health care provider. Make sure you discuss any questions you have with your health care provider. Document Revised: 05/29/2020 Document Reviewed: 12/19/2019 Elsevier Patient Education  2024 Elsevier Inc.Lung Cancer Screening A lung cancer screening is a test that checks for lung cancer when there are no symptoms or history of that disease. The screening is done to look for lung cancer in its very early stages. Finding cancer early improves the chances of successful treatment. It may save your life. Who should have a screening? You should be screened for lung cancer if all of these apply: You currently smoke or you used to smoke. You are between the ages of 76 and 73 years old. Screening may be recommended up to age 61 depending on your overall health and other factors. You have a smoking history of 1 pack of cigarettes a day for 20 years or 2 packs a day for 10 years. How is screening done?  The recommended screening test is a low-dose computed tomography (LDCT) scan. This scan takes detailed images of the lungs. This allows a health care provider to look for abnormal cells. If you are at risk for lung cancer, it is recommended that you get screened once a year. Talk to your health care provider about the risks, benefits, and limitations of screening. What are the benefits of screening? Screening can find lung cancer early, before symptoms start and before it has spread outside of the lungs. The chances of curing lung cancer are greater if the cancer is diagnosed early. What are the risks of screening? The screening may show lung cancer when no cancer is present. Talk with your health care provider about what your results  mean. In some cases, your health care provider may do more testing to confirm the results. The screening may not find lung cancer when it is present. You will be exposed to radiation from repeated LDCT tests, which can cause cancer in otherwise healthy people. How can I lower my risk of lung cancer? Make these lifestyle changes  to lower your risk of developing lung cancer: Do not use any products that contain nicotine or tobacco. These products include cigarettes, chewing tobacco, and vaping devices, such as e-cigarettes. If you need help quitting, ask your health care provider. Avoid secondhand smoke. Avoid exposure to radiation. Avoid exposure to radon gas. Have your home checked for radon regularly. Avoid things that cause cancer (carcinogens). Avoid living or working in places with high air pollution or diesel exhaust. Questions to ask your health care provider Am I eligible for lung cancer screening? Does my health insurance cover the cost of lung cancer screening? What happens if the lung cancer screening shows something of concern? How soon will I have results from my lung cancer screening? Is there anything that I need to do to prepare for my lung cancer screening? What happens if I decide not to have lung cancer screening? Where to find more information Ask your health care provider about the risks and benefits of screening. More information and resources are available from these organizations: American Cancer Society (ACS): cancer.org American Lung Association: lung.org National Cancer Institute: cancer.gov Contact a health care provider if: You start to show symptoms of lung cancer, including: A cough that will not go away. High-pitched whistling sounds when you breathe, most often when you breathe out (wheezing). Chest pain. Coughing up blood. Shortness of breath. Weight loss that cannot be explained. Constant tiredness (fatigue). Hoarse voice. Summary Lung cancer  screening may find lung cancer before symptoms appear. Finding cancer early improves the chances of successful treatment. It may save your life. The recommended screening test is a low-dose computed tomography (LDCT) scan that looks for abnormal cells in the lungs. If you are at risk for lung cancer, it is recommended that you get screened once a year. You can make lifestyle changes to lower your risk of lung cancer. Ask your health care provider about the risks and benefits of screening. This information is not intended to replace advice given to you by your health care provider. Make sure you discuss any questions you have with your health care provider. Document Revised: 09/07/2022 Document Reviewed: 02/18/2021 Elsevier Patient Education  2024 [employer].  Understanding Your Risk for Falls Millions of people have serious injuries from falls each year. It is important to understand your risk of falling. Talk with your health care provider about your risk and what you can do to lower it. If you do have a serious fall, make sure to tell your provider. Falling once raises your risk of falling again. How can falls affect me? Serious injuries from falls are common. These include: Broken bones, such as hip fractures. Head injuries, such as traumatic brain injuries (TBI) or concussions. A fear of falling can cause you to avoid activities and stay at home. This can make your muscles weaker and raise your risk for a fall. What can increase my risk? There are a number of risk factors that increase your risk for falling. The more risk factors you have, the higher your risk of falling. Serious injuries from a fall happen most often to people who are older than 71 years old. Teenagers and young adults ages 64-29 are also at higher risk. Common risk factors include: Weakness in the lower body. Being generally weak or confused due to long-term (chronic) illness. Dizziness or balance problems. Poor  vision. Medicines that cause dizziness or drowsiness. These may include: Medicines for your blood pressure, heart, anxiety, insomnia, or swelling (edema). Pain medicines. Muscle relaxants.  Other risk factors include: Drinking alcohol. Having had a fall in the past. Having foot pain or wearing improper footwear. Working at a dangerous job. Having any of the following in your home: Tripping hazards, such as floor clutter or loose rugs. Poor lighting. Pets. Having dementia or memory loss. What actions can I take to lower my risk of falling?     Physical activity Stay physically fit. Do strength and balance exercises. Consider taking a regular class to build strength and balance. Yoga and tai chi are good options. Vision Have your eyes checked every year and your prescription for glasses or contacts updated as needed. Shoes and walking aids Wear non-skid shoes. Wear shoes that have rubber soles and low heels. Do not wear high heels. Do not walk around the house in socks or slippers. Use a cane or walker as told by your provider. Home safety Attach secure railings on both sides of your stairs. Install grab bars for your bathtub, shower, and toilet. Use a non-skid mat in your bathtub or shower. Attach bath mats securely with double-sided, non-slip rug tape. Use good lighting in all rooms. Keep a flashlight near your bed. Make sure there is a clear path from your bed to the bathroom. Use night-lights. Do not use throw rugs. Make sure all carpeting is taped or tacked down securely. Remove all clutter from walkways and stairways, including extension cords. Repair uneven or broken steps and floors. Avoid walking on icy or slippery surfaces. Walk on the grass instead of on icy or slick sidewalks. Use ice melter to get rid of ice on walkways in the winter. Use a cordless phone. Questions to ask your health care provider Can you help me check my risk for a fall? Do any of my medicines  make me more likely to fall? Should I take a vitamin D supplement? What exercises can I do to improve my strength and balance? Should I make an appointment to have my vision checked? Do I need a bone density test to check for weak bones (osteoporosis)? Would it help to use a cane or a walker? Where to find more information Centers for Disease Control and Prevention, STEADI: TonerPromos.no Community-Based Fall Prevention Programs: TonerPromos.no [employer] on Aging: BaseRingTones.pl Contact a health care provider if: You fall at home. You are afraid of falling at home. You feel weak, drowsy, or dizzy. This information is not intended to replace advice given to you by your health care provider. Make sure you discuss any questions you have with your health care provider. Document Revised: 05/03/2022 Document Reviewed: 05/03/2022 Elsevier Patient Education  2024 [employer].

## 2023-12-12 ENCOUNTER — Other Ambulatory Visit: Payer: Self-pay | Admitting: Family Medicine

## 2023-12-12 NOTE — Telephone Encounter (Signed)
 Copied from CRM [id number]. Topic: Clinical - Medication Refill >> Dec 12, 2023  4:56 PM Higinio Roger wrote: Most Recent Primary Care Visit:  Provider: Recardo Evangelist A  Department: [facility]  Visit Type: MEDICARE AWV, SEQUENTIAL  Date: 10/11/2023  Medication: meclizine (ANTIVERT) 25 MG tablet  Has the patient contacted their pharmacy? Yes (Agent: If no, request that the patient contact the pharmacy for the refill. If patient does not wish to contact the pharmacy document the reason why and proceed with request.) (Agent: If yes, when and what did the pharmacy advise?)  Is this the correct pharmacy for this prescription? Yes If no, delete pharmacy and type the correct one.  This is the patient's preferred pharmacy:  [facility] [address], [place] - [address] [place] [address] [place] [address] Phone: [phone] Fax: [phone]   Has the prescription been filled recently? Yes  Is the patient out of the medication? Yes  Has the patient been seen for an appointment in the last year OR does the patient have an upcoming appointment? Yes  Can we respond through MyChart? No. Call [phone]  Agent: Please be advised that Rx refills may take up to 3 business days. We ask that you follow-up with your pharmacy.

## 2023-12-13 MED ORDER — MECLIZINE HCL 25 MG PO TABS: 25.0000 mg | ORAL_TABLET | Freq: Three times a day (TID) | ORAL | 0 refills | Status: DC

## 2024-01-11 ENCOUNTER — Other Ambulatory Visit: Payer: Self-pay | Admitting: Family Medicine

## 2024-01-12 ENCOUNTER — Other Ambulatory Visit: Payer: Self-pay | Admitting: Family Medicine

## 2024-01-17 ENCOUNTER — Inpatient Hospital Stay (HOSPITAL_COMMUNITY)
Admission: EM | Admit: 2024-01-17 | Discharge: 2024-01-20 | DRG: 872 | Disposition: A | Discharge status: Home / Self Care | Attending: Internal Medicine | Admitting: Internal Medicine

## 2024-01-17 ENCOUNTER — Emergency Department (HOSPITAL_COMMUNITY)

## 2024-01-17 ENCOUNTER — Encounter (HOSPITAL_COMMUNITY): Payer: Self-pay | Admitting: Emergency Medicine

## 2024-01-17 ENCOUNTER — Other Ambulatory Visit: Payer: Self-pay

## 2024-01-17 DIAGNOSIS — F17210 Nicotine dependence, cigarettes, uncomplicated: Secondary | ICD-10-CM | POA: Diagnosis present

## 2024-01-17 DIAGNOSIS — R42 Dizziness and giddiness: Secondary | ICD-10-CM | POA: Diagnosis present

## 2024-01-17 DIAGNOSIS — E119 Type 2 diabetes mellitus without complications: Secondary | ICD-10-CM | POA: Diagnosis present

## 2024-01-17 DIAGNOSIS — E46 Unspecified protein-calorie malnutrition: Secondary | ICD-10-CM | POA: Diagnosis not present

## 2024-01-17 DIAGNOSIS — Z1152 Encounter for screening for COVID-19: Secondary | ICD-10-CM

## 2024-01-17 DIAGNOSIS — I700 Atherosclerosis of aorta: Secondary | ICD-10-CM | POA: Diagnosis not present

## 2024-01-17 DIAGNOSIS — Z79899 Other long term (current) drug therapy: Secondary | ICD-10-CM | POA: Diagnosis not present

## 2024-01-17 DIAGNOSIS — Z7982 Long term (current) use of aspirin: Secondary | ICD-10-CM

## 2024-01-17 DIAGNOSIS — A4151 Sepsis due to Escherichia coli [E. coli]: Secondary | ICD-10-CM | POA: Diagnosis present

## 2024-01-17 DIAGNOSIS — N179 Acute kidney failure, unspecified: Secondary | ICD-10-CM | POA: Insufficient documentation

## 2024-01-17 DIAGNOSIS — R197 Diarrhea, unspecified: Secondary | ICD-10-CM | POA: Diagnosis present

## 2024-01-17 DIAGNOSIS — R911 Solitary pulmonary nodule: Secondary | ICD-10-CM | POA: Insufficient documentation

## 2024-01-17 DIAGNOSIS — Z6821 Body mass index (BMI) 21.0-21.9, adult: Secondary | ICD-10-CM

## 2024-01-17 DIAGNOSIS — N2889 Other specified disorders of kidney and ureter: Secondary | ICD-10-CM | POA: Diagnosis not present

## 2024-01-17 DIAGNOSIS — R6520 Severe sepsis without septic shock: Secondary | ICD-10-CM | POA: Diagnosis present

## 2024-01-17 DIAGNOSIS — E440 Moderate protein-calorie malnutrition: Secondary | ICD-10-CM | POA: Diagnosis present

## 2024-01-17 DIAGNOSIS — E861 Hypovolemia: Secondary | ICD-10-CM | POA: Diagnosis present

## 2024-01-17 DIAGNOSIS — R918 Other nonspecific abnormal finding of lung field: Secondary | ICD-10-CM | POA: Diagnosis present

## 2024-01-17 DIAGNOSIS — R509 Fever, unspecified: Secondary | ICD-10-CM | POA: Diagnosis not present

## 2024-01-17 DIAGNOSIS — W1830XA Fall on same level, unspecified, initial encounter: Secondary | ICD-10-CM | POA: Diagnosis present

## 2024-01-17 DIAGNOSIS — Y92099 Unspecified place in other non-institutional residence as the place of occurrence of the external cause: Secondary | ICD-10-CM | POA: Diagnosis not present

## 2024-01-17 DIAGNOSIS — E860 Dehydration: Secondary | ICD-10-CM | POA: Insufficient documentation

## 2024-01-17 DIAGNOSIS — E782 Mixed hyperlipidemia: Secondary | ICD-10-CM | POA: Diagnosis present

## 2024-01-17 DIAGNOSIS — D638 Anemia in other chronic diseases classified elsewhere: Secondary | ICD-10-CM | POA: Diagnosis present

## 2024-01-17 DIAGNOSIS — I10 Essential (primary) hypertension: Secondary | ICD-10-CM | POA: Diagnosis present

## 2024-01-17 DIAGNOSIS — E8721 Acute metabolic acidosis: Secondary | ICD-10-CM | POA: Diagnosis present

## 2024-01-17 DIAGNOSIS — N390 Urinary tract infection, site not specified: Secondary | ICD-10-CM | POA: Diagnosis present

## 2024-01-17 DIAGNOSIS — N281 Cyst of kidney, acquired: Secondary | ICD-10-CM | POA: Diagnosis not present

## 2024-01-17 DIAGNOSIS — E8809 Other disorders of plasma-protein metabolism, not elsewhere classified: Secondary | ICD-10-CM | POA: Diagnosis present

## 2024-01-17 DIAGNOSIS — W19XXXA Unspecified fall, initial encounter: Principal | ICD-10-CM

## 2024-01-17 DIAGNOSIS — Z0389 Encounter for observation for other suspected diseases and conditions ruled out: Secondary | ICD-10-CM | POA: Diagnosis not present

## 2024-01-17 DIAGNOSIS — N10 Acute tubulo-interstitial nephritis: Secondary | ICD-10-CM | POA: Diagnosis present

## 2024-01-17 DIAGNOSIS — E871 Hypo-osmolality and hyponatremia: Secondary | ICD-10-CM | POA: Insufficient documentation

## 2024-01-17 DIAGNOSIS — R000 Tachycardia, unspecified: Secondary | ICD-10-CM | POA: Diagnosis not present

## 2024-01-17 LAB — COMPREHENSIVE METABOLIC PANEL WITH GFR
ALT: 14 U/L (ref 0–44)
AST: 19 U/L (ref 15–41)
Albumin: 2.9 g/dL — ABNORMAL LOW (ref 3.5–5.0)
Alkaline Phosphatase: 58 U/L (ref 38–126)
Anion gap: 10 (ref 5–15)
BUN: 43 mg/dL — ABNORMAL HIGH (ref 8–23)
CO2: 20 mmol/L — ABNORMAL LOW (ref 22–32)
Calcium: 8.5 mg/dL — ABNORMAL LOW (ref 8.9–10.3)
Chloride: 101 mmol/L (ref 98–111)
Creatinine, Ser: 2.12 mg/dL — ABNORMAL HIGH (ref 0.61–1.24)
GFR, Estimated: 33 mL/min — ABNORMAL LOW (ref >60)
Glucose, Bld: 134 mg/dL — ABNORMAL HIGH (ref 70–99)
Potassium: 3.7 mmol/L (ref 3.5–5.1)
Sodium: 131 mmol/L — ABNORMAL LOW (ref 135–145)
Total Bilirubin: 0.7 mg/dL (ref 0.0–1.2)
Total Protein: 8 g/dL (ref 6.5–8.1)

## 2024-01-17 LAB — URINALYSIS, W/ REFLEX TO CULTURE (INFECTION SUSPECTED)
Bilirubin Urine: NEGATIVE
Glucose, UA: NEGATIVE mg/dL
Ketones, ur: NEGATIVE mg/dL
Nitrite: NEGATIVE
Protein, ur: 100 mg/dL — AB
Specific Gravity, Urine: 1.013 (ref 1.005–1.030)
WBC, UA: >50 WBC/hpf (ref 0–5)
pH: 5 (ref 5.0–8.0)

## 2024-01-17 LAB — CBC WITH DIFFERENTIAL/PLATELET
Abs Immature Granulocytes: 0.04 10*3/uL (ref 0.00–0.07)
Basophils Absolute: 0 10*3/uL (ref 0.0–0.1)
Basophils Relative: 0 %
Eosinophils Absolute: 0 10*3/uL (ref 0.0–0.5)
Eosinophils Relative: 0 %
HCT: 32.4 % — ABNORMAL LOW (ref 39.0–52.0)
Hemoglobin: 11 g/dL — ABNORMAL LOW (ref 13.0–17.0)
Immature Granulocytes: 0 %
Lymphocytes Relative: 9 %
Lymphs Abs: 0.9 10*3/uL (ref 0.7–4.0)
MCH: 29.8 pg (ref 26.0–34.0)
MCHC: 34 g/dL (ref 30.0–36.0)
MCV: 87.8 fL (ref 80.0–100.0)
Monocytes Absolute: 1.1 10*3/uL — ABNORMAL HIGH (ref 0.1–1.0)
Monocytes Relative: 12 %
Neutro Abs: 7.2 10*3/uL (ref 1.7–7.7)
Neutrophils Relative %: 79 %
Platelets: 322 10*3/uL (ref 150–400)
RBC: 3.69 MIL/uL — ABNORMAL LOW (ref 4.22–5.81)
RDW: 13.4 % (ref 11.5–15.5)
WBC: 9.2 10*3/uL (ref 4.0–10.5)
nRBC: 0 % (ref 0.0–0.2)

## 2024-01-17 LAB — MAGNESIUM: Magnesium: 2.2 mg/dL (ref 1.7–2.4)

## 2024-01-17 LAB — LACTIC ACID, PLASMA: Lactic Acid, Venous: 1.4 mmol/L (ref 0.5–1.9)

## 2024-01-17 MED ORDER — VANCOMYCIN HCL IN DEXTROSE 1-5 GM/200ML-% IV SOLN
1000.0000 mg | Freq: Once | INTRAVENOUS | Status: AC
  Administered 2024-01-18: 1000 mg via INTRAVENOUS
  Filled 2024-01-17: qty 200

## 2024-01-17 MED ORDER — METRONIDAZOLE 500 MG/100ML IV SOLN
500.0000 mg | Freq: Once | INTRAVENOUS | Status: AC
  Administered 2024-01-17: 500 mg via INTRAVENOUS
  Filled 2024-01-17: qty 100

## 2024-01-17 MED ORDER — LACTATED RINGERS IV BOLUS (SEPSIS)
1000.0000 mL | Freq: Once | INTRAVENOUS | Status: AC
  Administered 2024-01-17: 1000 mL via INTRAVENOUS

## 2024-01-17 MED ORDER — SODIUM CHLORIDE 0.9 % IV SOLN
2.0000 g | Freq: Once | INTRAVENOUS | Status: AC
  Administered 2024-01-17: 2 g via INTRAVENOUS
  Filled 2024-01-17: qty 12.5

## 2024-01-17 MED ORDER — ACETAMINOPHEN 325 MG PO TABS
650.0000 mg | ORAL_TABLET | Freq: Once | ORAL | Status: AC
  Administered 2024-01-17: 650 mg via ORAL
  Filled 2024-01-17: qty 2

## 2024-01-17 MED ORDER — LACTATED RINGERS IV BOLUS (SEPSIS)
500.0000 mL | Freq: Once | INTRAVENOUS | Status: AC
  Administered 2024-01-18: 500 mL via INTRAVENOUS

## 2024-01-17 NOTE — ED Provider Notes (Signed)
 [place] EMERGENCY DEPARTMENT AT [facility] Provider Note   CSN: [id number] Arrival date & time: 01/17/24  2209     History  Chief Complaint  Patient presents with   Fever   Fall    Norman Campbell is a 73 y.o. male.  HPI Patient presents for generalized weakness and fall.  Medical history includes DM, HLD, HTN.  He lives at home with wife.  Wife reports that he has had recent watery diarrhea.  Today, he had a ground-level fall which he attributes to generalized weakness and dizziness.  He does have a history of vertigo.  EMS noted fever of 103 degrees orally during transit.  He was also tachycardic.  Patient currently denies any areas of discomfort.  He denies shortness of breath.  He does endorse ongoing wooziness.    Home Medications Prior to Admission medications   Medication Sig Start Date End Date Taking? Authorizing Provider  acetaminophen  (TYLENOL ) 500 MG tablet Take 500 mg by mouth every 6 (six) hours as needed.    [provider]  amLODipine  (NORVASC ) 5 MG tablet Take 2 tablets by mouth once daily 01/12/24   Zarwolo, Gloria, FNP  aspirin 81 MG tablet 81 mg.    [provider]  Chromium Picolinate (CHROMIUM PICOLATE PO) Take by mouth.    [provider]  Ensure (ENSURE) Take 237 mLs by mouth 3 (three) times daily with meals. Patient not taking: Reported on 10/11/2023 09/15/21   Paseda, Folashade R, FNP  lovastatin  (MEVACOR ) 40 MG tablet Take 1 tablet (40 mg total) by mouth at bedtime. 08/11/22   Zarwolo, Gloria, FNP  meclizine  (ANTIVERT ) 25 MG tablet TAKE 1 TABLET BY MOUTH THREE TIMES DAILY 01/12/24   Zarwolo, Gloria, FNP  olmesartan  (BENICAR ) 20 MG tablet Take 1 tablet by mouth once daily 01/12/24   Zarwolo, Gloria, FNP      Allergies    Patient has no known allergies.    Review of Systems   Review of Systems  Constitutional:  Positive for fever.  Gastrointestinal:  Positive for diarrhea.  Neurological:  Positive for dizziness and  light-headedness.  All other systems reviewed and are negative.   Physical Exam Updated Vital Signs BP 137/65 (BP Location: Left Arm)   Pulse (!) 102   Temp (!) 102.8 F (39.3 C)   Resp 16   Ht 6\' 2"  (1.88 m)   Wt 74.6 kg   SpO2 93%   BMI 21.12 kg/m  Physical Exam Vitals and nursing note reviewed.  Constitutional:      General: He is not in acute distress.    Appearance: Normal appearance. He is well-developed. He is not ill-appearing, toxic-appearing or diaphoretic.  HENT:     Head: Normocephalic and atraumatic.     Right Ear: External ear normal.     Left Ear: External ear normal.     Nose: Nose normal.     Mouth/Throat:     Mouth: Mucous membranes are moist.  Eyes:     Extraocular Movements: Extraocular movements intact.     Conjunctiva/sclera: Conjunctivae normal.  Cardiovascular:     Rate and Rhythm: Regular rhythm. Tachycardia present.     Heart sounds: No murmur heard. Pulmonary:     Effort: Pulmonary effort is normal. No respiratory distress.     Breath sounds: Normal breath sounds. No wheezing or rales.  Chest:     Chest wall: No tenderness.  Abdominal:     General: There is no distension.  Palpations: Abdomen is soft.     Tenderness: There is no abdominal tenderness.  Musculoskeletal:        General: No swelling. Normal range of motion.     Cervical back: Normal range of motion and neck supple.     Right lower leg: No edema.     Left lower leg: No edema.  Skin:    General: Skin is warm and dry.     Coloration: Skin is not jaundiced or pale.  Neurological:     General: No focal deficit present.     Mental Status: He is alert.  Psychiatric:        Mood and Affect: Mood normal.        Behavior: Behavior normal.     ED Results / Procedures / Treatments   Labs (all labs ordered are listed, but only abnormal results are displayed) Labs Reviewed  CBC WITH DIFFERENTIAL/PLATELET - Abnormal; Notable for the following components:      Result Value    RBC 3.69 (*)    Hemoglobin 11.0 (*)    HCT 32.4 (*)    Monocytes Absolute 1.1 (*)    All other components within normal limits  RESP PANEL BY RT-PCR (RSV, FLU A&B, COVID)  [id number]  CULTURE, BLOOD (ROUTINE X 2)  CULTURE, BLOOD (ROUTINE X 2)  C DIFFICILE QUICK SCREEN W PCR REFLEX    LACTIC ACID, PLASMA  LACTIC ACID, PLASMA  COMPREHENSIVE METABOLIC PANEL WITH GFR  URINALYSIS, W/ REFLEX TO CULTURE (INFECTION SUSPECTED)  MAGNESIUM  I-STAT CHEM 8, ED    EKG EKG Interpretation Date/Time:  Tuesday Jan 17 2024 23:10:42 EDT Ventricular Rate:  103 PR Interval:  165 QRS Duration:  95 QT Interval:  328 QTC Calculation: 430 R Axis:   20  Text Interpretation: Sinus tachycardia Confirmed by Iva Mariner [phone]) on 01/17/2024 11:16:31 PM  Radiology DG Chest Port 1 View Result Date: 01/17/2024 CLINICAL DATA:  Questionable sepsis-evaluate for abnormality EXAM: PORTABLE CHEST 1 VIEW COMPARISON:  04/27/2010 FINDINGS: Stable cardiomediastinal silhouette. Aortic atherosclerotic calcification. No focal consolidation, pleural effusion, or pneumothorax. No displaced rib fractures. IMPRESSION: No active disease. Electronically Signed   By: Rozell Cornet M.D.   On: 01/17/2024 22:46    Procedures Procedures    Medications Ordered in ED Medications  lactated ringers bolus 1,000 mL (1,000 mLs Intravenous New Bag/Given 01/17/24 2256)    And  lactated ringers bolus 1,000 mL (1,000 mLs Intravenous New Bag/Given 01/17/24 2314)    And  lactated ringers bolus 500 mL (has no administration in time range)  metroNIDAZOLE (FLAGYL) IVPB 500 mg (500 mg Intravenous New Bag/Given 01/17/24 2255)  vancomycin (VANCOCIN) IVPB 1000 mg/200 mL premix (has no administration in time range)  ceFEPIme (MAXIPIME) 2 g in sodium chloride  0.9 % 100 mL IVPB (2 g Intravenous New Bag/Given 01/17/24 2254)  acetaminophen  (TYLENOL ) tablet 650 mg (650 mg Oral Given 01/17/24 2250)    ED Course/ Medical Decision Making/ A&P                                  Medical Decision Making Amount and/or Complexity of Data Reviewed Labs: ordered. Radiology: ordered.  Risk OTC drugs. Prescription drug management.   This patient presents to the ED for concern of fever and generalized weakness, this involves an extensive number of treatment options, and is a complaint that carries with it a high risk of complications and morbidity.  The  differential diagnosis includes sepsis, bacterial infection, viral infection, dehydration, metabolic derangements   Co morbidities that complicate the patient evaluation  DM, HLD, HTN   Additional history obtained:  Additional history obtained from EMS External records from outside source obtained and reviewed including EMR   Lab Tests:  I Ordered, and personally interpreted labs.  The pertinent results include: (Pending at time of signout)   Imaging Studies ordered:  I ordered imaging studies including chest x-ray I independently visualized and interpreted imaging which showed no acute findings I agree with the radiologist interpretation   Cardiac Monitoring: / EKG:  The patient was maintained on a cardiac monitor.  I personally viewed and interpreted the cardiac monitored which showed an underlying rhythm of: Sinus rhythm   Problem List / ED Course / Critical interventions / Medication management  Patient presenting via EMS from home.  Initial call out was for a fall.  During transit, they did note fever of 103 degrees and tachycardia.  Patient remains febrile and tachycardic on arrival.  He is overall well-appearing.  Current breathing is unlabored.  SpO2 is normal on room air.  He denies any complaints at this time other than what he describes as ongoing wooziness.  Septic workup and treatment were initiated.  Care of patient was signed out to oncoming ED provider.. I ordered medication including IV fluids and broad-spectrum antibiotics for empiric treatment of sepsis; Tylenol  for  antipyresis Reevaluation of the patient after these medicines showed that the patient improved I have reviewed the patients home medicines and have made adjustments as needed   Social Determinants of Health:  Lives at home with wife         Final Clinical Impression(s) / ED Diagnoses Final diagnoses:  Fall, initial encounter  Fever in adult    Rx / DC Orders ED Discharge Orders     None         Iva Mariner, MD 01/17/24 [phone]

## 2024-01-17 NOTE — ED Triage Notes (Signed)
 BIB [facility] EMS with c/o fever for 2 days and fall tonight. Patient states he has vertigo and got dizzy and fell, denies pain or injury. States he has had a mild cough but denies, abdominal pain or urinary complaints. States he has had diarrhea.

## 2024-01-18 ENCOUNTER — Encounter (HOSPITAL_COMMUNITY): Payer: Self-pay | Admitting: Internal Medicine

## 2024-01-18 ENCOUNTER — Inpatient Hospital Stay (HOSPITAL_COMMUNITY)

## 2024-01-18 ENCOUNTER — Other Ambulatory Visit: Payer: Self-pay

## 2024-01-18 DIAGNOSIS — E119 Type 2 diabetes mellitus without complications: Secondary | ICD-10-CM | POA: Diagnosis present

## 2024-01-18 DIAGNOSIS — E8809 Other disorders of plasma-protein metabolism, not elsewhere classified: Secondary | ICD-10-CM | POA: Diagnosis present

## 2024-01-18 DIAGNOSIS — N179 Acute kidney failure, unspecified: Secondary | ICD-10-CM | POA: Insufficient documentation

## 2024-01-18 DIAGNOSIS — R911 Solitary pulmonary nodule: Secondary | ICD-10-CM | POA: Diagnosis present

## 2024-01-18 DIAGNOSIS — Z6821 Body mass index (BMI) 21.0-21.9, adult: Secondary | ICD-10-CM | POA: Diagnosis not present

## 2024-01-18 DIAGNOSIS — Y92099 Unspecified place in other non-institutional residence as the place of occurrence of the external cause: Secondary | ICD-10-CM | POA: Diagnosis not present

## 2024-01-18 DIAGNOSIS — E861 Hypovolemia: Secondary | ICD-10-CM | POA: Diagnosis present

## 2024-01-18 DIAGNOSIS — E440 Moderate protein-calorie malnutrition: Secondary | ICD-10-CM | POA: Diagnosis present

## 2024-01-18 DIAGNOSIS — E871 Hypo-osmolality and hyponatremia: Secondary | ICD-10-CM | POA: Diagnosis present

## 2024-01-18 DIAGNOSIS — E782 Mixed hyperlipidemia: Secondary | ICD-10-CM | POA: Diagnosis present

## 2024-01-18 DIAGNOSIS — Z79899 Other long term (current) drug therapy: Secondary | ICD-10-CM | POA: Diagnosis not present

## 2024-01-18 DIAGNOSIS — E860 Dehydration: Secondary | ICD-10-CM | POA: Diagnosis present

## 2024-01-18 DIAGNOSIS — N10 Acute tubulo-interstitial nephritis: Secondary | ICD-10-CM | POA: Diagnosis present

## 2024-01-18 DIAGNOSIS — Z7982 Long term (current) use of aspirin: Secondary | ICD-10-CM | POA: Diagnosis not present

## 2024-01-18 DIAGNOSIS — N390 Urinary tract infection, site not specified: Secondary | ICD-10-CM | POA: Diagnosis present

## 2024-01-18 DIAGNOSIS — E46 Unspecified protein-calorie malnutrition: Secondary | ICD-10-CM

## 2024-01-18 DIAGNOSIS — R42 Dizziness and giddiness: Secondary | ICD-10-CM | POA: Diagnosis present

## 2024-01-18 DIAGNOSIS — R6520 Severe sepsis without septic shock: Secondary | ICD-10-CM | POA: Diagnosis present

## 2024-01-18 DIAGNOSIS — A4151 Sepsis due to Escherichia coli [E. coli]: Secondary | ICD-10-CM | POA: Diagnosis present

## 2024-01-18 DIAGNOSIS — I10 Essential (primary) hypertension: Secondary | ICD-10-CM | POA: Diagnosis present

## 2024-01-18 DIAGNOSIS — D638 Anemia in other chronic diseases classified elsewhere: Secondary | ICD-10-CM | POA: Diagnosis present

## 2024-01-18 DIAGNOSIS — R197 Diarrhea, unspecified: Secondary | ICD-10-CM | POA: Diagnosis present

## 2024-01-18 DIAGNOSIS — Z1152 Encounter for screening for COVID-19: Secondary | ICD-10-CM | POA: Diagnosis not present

## 2024-01-18 DIAGNOSIS — E8721 Acute metabolic acidosis: Secondary | ICD-10-CM | POA: Diagnosis present

## 2024-01-18 DIAGNOSIS — F17210 Nicotine dependence, cigarettes, uncomplicated: Secondary | ICD-10-CM | POA: Diagnosis present

## 2024-01-18 DIAGNOSIS — R918 Other nonspecific abnormal finding of lung field: Secondary | ICD-10-CM | POA: Diagnosis present

## 2024-01-18 DIAGNOSIS — W1830XA Fall on same level, unspecified, initial encounter: Secondary | ICD-10-CM | POA: Diagnosis present

## 2024-01-18 LAB — BLOOD CULTURE ID PANEL (REFLEXED) - BCID2

## 2024-01-18 LAB — CBC
HCT: 30.2 % — ABNORMAL LOW (ref 39.0–52.0)
Hemoglobin: 9.9 g/dL — ABNORMAL LOW (ref 13.0–17.0)
MCH: 28.3 pg (ref 26.0–34.0)
MCHC: 32.8 g/dL (ref 30.0–36.0)
MCV: 86.3 fL (ref 80.0–100.0)
Platelets: 261 10*3/uL (ref 150–400)
RBC: 3.5 MIL/uL — ABNORMAL LOW (ref 4.22–5.81)
RDW: 13.3 % (ref 11.5–15.5)
WBC: 8.7 10*3/uL (ref 4.0–10.5)
nRBC: 0 % (ref 0.0–0.2)

## 2024-01-18 LAB — COMPREHENSIVE METABOLIC PANEL WITH GFR
ALT: 14 U/L (ref 0–44)
AST: 20 U/L (ref 15–41)
Albumin: 2.5 g/dL — ABNORMAL LOW (ref 3.5–5.0)
Alkaline Phosphatase: 50 U/L (ref 38–126)
Anion gap: 9 (ref 5–15)
BUN: 40 mg/dL — ABNORMAL HIGH (ref 8–23)
CO2: 21 mmol/L — ABNORMAL LOW (ref 22–32)
Calcium: 8.2 mg/dL — ABNORMAL LOW (ref 8.9–10.3)
Chloride: 103 mmol/L (ref 98–111)
Creatinine, Ser: 1.68 mg/dL — ABNORMAL HIGH (ref 0.61–1.24)
GFR, Estimated: 43 mL/min — ABNORMAL LOW (ref >60)
Glucose, Bld: 132 mg/dL — ABNORMAL HIGH (ref 70–99)
Potassium: 3.9 mmol/L (ref 3.5–5.1)
Sodium: 133 mmol/L — ABNORMAL LOW (ref 135–145)
Total Bilirubin: 0.7 mg/dL (ref 0.0–1.2)
Total Protein: 7.1 g/dL (ref 6.5–8.1)

## 2024-01-18 LAB — OSMOLALITY, URINE: Osmolality, Ur: 368 mosm/kg (ref 300–900)

## 2024-01-18 LAB — OSMOLALITY: Osmolality: 297 mosm/kg — ABNORMAL HIGH (ref 275–295)

## 2024-01-18 LAB — RESP PANEL BY RT-PCR (RSV, FLU A&B, COVID)  RVPGX2
Influenza A by PCR: NEGATIVE
Influenza B by PCR: NEGATIVE
Resp Syncytial Virus by PCR: NEGATIVE
SARS Coronavirus 2 by RT PCR: NEGATIVE

## 2024-01-18 LAB — PHOSPHORUS: Phosphorus: 2.5 mg/dL (ref 2.5–4.6)

## 2024-01-18 LAB — MAGNESIUM: Magnesium: 2.1 mg/dL (ref 1.7–2.4)

## 2024-01-18 LAB — SODIUM, URINE, RANDOM: Sodium, Ur: 42 mmol/L

## 2024-01-18 MED ORDER — ONDANSETRON HCL 4 MG/2ML IJ SOLN: 4.0000 mg | Freq: Four times a day (QID) | INTRAMUSCULAR | Status: DC | PRN

## 2024-01-18 MED ORDER — LACTATED RINGERS IV SOLN
INTRAVENOUS | Status: AC
  Administered 2024-01-18: 80 mL/h via INTRAVENOUS

## 2024-01-18 MED ORDER — IRBESARTAN 150 MG PO TABS: 150.0000 mg | ORAL_TABLET | Freq: Every day | ORAL | Status: DC

## 2024-01-18 MED ORDER — ACETAMINOPHEN 650 MG RE SUPP: 650.0000 mg | Freq: Four times a day (QID) | RECTAL | Status: DC | PRN

## 2024-01-18 MED ORDER — SODIUM CHLORIDE 0.9 % IV SOLN
1.0000 g | INTRAVENOUS | Status: DC
  Administered 2024-01-18: 1 g via INTRAVENOUS
  Filled 2024-01-18: qty 10

## 2024-01-18 MED ORDER — SODIUM CHLORIDE 0.9 % IV SOLN
2.0000 g | INTRAVENOUS | Status: DC
  Administered 2024-01-19 – 2024-01-20 (×2): 2 g via INTRAVENOUS
  Filled 2024-01-18 (×2): qty 20

## 2024-01-18 MED ORDER — SODIUM CHLORIDE 0.9 % IV SOLN
1.0000 g | Freq: Once | INTRAVENOUS | Status: AC
  Administered 2024-01-18: 1 g via INTRAVENOUS
  Filled 2024-01-18: qty 10

## 2024-01-18 MED ORDER — ONDANSETRON HCL 4 MG PO TABS: 4.0000 mg | ORAL_TABLET | Freq: Four times a day (QID) | ORAL | Status: DC | PRN

## 2024-01-18 MED ORDER — AMLODIPINE BESYLATE 5 MG PO TABS: 10.0000 mg | ORAL_TABLET | Freq: Every day | ORAL | Status: DC

## 2024-01-18 MED ORDER — ACETAMINOPHEN 325 MG PO TABS
650.0000 mg | ORAL_TABLET | Freq: Four times a day (QID) | ORAL | Status: DC | PRN
  Administered 2024-01-18: 650 mg via ORAL
  Filled 2024-01-18: qty 2

## 2024-01-18 MED ORDER — ENOXAPARIN SODIUM 40 MG/0.4ML IJ SOSY
40.0000 mg | PREFILLED_SYRINGE | INTRAMUSCULAR | Status: DC
  Administered 2024-01-18 – 2024-01-20 (×3): 40 mg via SUBCUTANEOUS
  Filled 2024-01-18 (×3): qty 0.4

## 2024-01-18 MED ORDER — PRAVASTATIN SODIUM 40 MG PO TABS
40.0000 mg | ORAL_TABLET | Freq: Every day | ORAL | Status: DC
  Administered 2024-01-18 – 2024-01-19 (×2): 40 mg via ORAL
  Filled 2024-01-18 (×2): qty 1

## 2024-01-18 MED ORDER — GLUCERNA SHAKE PO LIQD
237.0000 mL | Freq: Three times a day (TID) | ORAL | Status: DC
  Administered 2024-01-18 – 2024-01-20 (×5): 237 mL via ORAL
  Filled 2024-01-18 (×5): qty 237

## 2024-01-18 NOTE — Progress Notes (Addendum)
 Patient seen and examined personally, I reviewed the chart, history and physical and admission note, done by admitting physician this morning and agree with the same with following addendum.  Please refer to the morning admission note for more detailed plan of care.  Briefly 73 yom w/ HTN HLD, vertigo who presented to the ED via EMS due to fall sustained at home which was thought to be due to generalized weakness and dizziness.  Patient stated he recently had some diarrhea, EMS was activated was noted to have a temperature of 103F  In ED: febrile with a temperature of 102.16F, BP 154/65,  labs-normocytic anemia and BMP showed sodium 131, potassium 3.7, chloride 101, bicarb 20, blood glucose134, BUN/creatinine 43/2.12 (baseline creatinine at 1.2), lactate normal, magnesium 2.3, UA-suggestive of UTI.  Influenza A, B, SARS-2, RSV was negative. CXR:no active disease Urine culture blood culture sent,given IV cefepime, metronidazole, vancomycin ivf and admitted for further management   Subjective/on exam: Seen thsi am Wife at bedside No new complaints Watery dark diarrhea x 2 days no N/V/Abd pain Wife says he was moaning each time he was voiding at home Temperature better at 19.1 vitals stable otherwise Labs showed hyponatremia creatinine down to 1.6 CBC with hemoglobin down to 9.9g On exam:  AAOX3 NAD lungs clear Abd NTND BS+ Non focal on exam  Assessment/Plan:   UTI POA GNR bacteremia and sepsis POA: On admit-fever 102 heart rate 103, normal lactic acid and WBC count.  UA grossly abnormal concerning for UTI chest x-ray no acute finding. Will continue ceftriaxone follow-up urine blood culture Lab informed of positive blood culture will increase antibiotics discussed with pharmacy  AKI  B/l creat 1.1 in 09/17/22, improving slowly, continue IV fluid hydration avoid nephrotoxic medication, renally dose meds    Hyponatremia: Suspect hypovolemic, continue IV fluids  Diarrhea: Check stool  studies  Hypoalbuminemia R/o malnutrition.   Essential hypertension: BP stable. Hold off on amlodipine  Avapro   Mixed hyperlipidemia Continue Pravachol ,

## 2024-01-18 NOTE — Progress Notes (Signed)
 PHARMACY - PHYSICIAN COMMUNICATION CRITICAL VALUE ALERT - BLOOD CULTURE IDENTIFICATION (BCID)  STRYDER THORNDYKE is an 73 y.o. male who presented to [facility] on 01/17/2024   Assessment:  E.coli bacteremia- no resistance   Current antibiotics: Ceftriaxone 2000 mg IV every 24 hours  Changes to prescribed antibiotics recommended:  Patient is on recommended antibiotics - No changes needed  No results found for this or any previous visit.  Cliffton Dama, PharmD Clinical Pharmacist 01/18/2024 3:39 PM

## 2024-01-18 NOTE — Plan of Care (Signed)

## 2024-01-18 NOTE — Progress Notes (Addendum)
 CRITICAL VALUE STICKER  CRITICAL VALUE: 2 aerobic bottles postivie for gram negative rods  RECEIVER (on-site recipient of call): B. Darnelle Elders, RN   DATE & TIME NOTIFIED:  01/18/24 at 1140  MESSENGER (representative from lab): Jackquline Massa  MD NOTIFIED:  Dr. Bobbetta Burnet  TIME OF NOTIFICATION: 01/18/24 at 1141  RESPONSE: no new orders

## 2024-01-18 NOTE — H&P (Signed)
 History and Physical    Patient: Norman Campbell FAO:[id number] DOB: 04-14-1951 DOA: 01/17/2024 DOS: the patient was seen and examined on 01/18/2024 PCP: Zarwolo, Gloria, FNP  Patient coming from: Home  Chief Complaint:  Chief Complaint  Patient presents with   Fever   Fall   HPI: BARKON CLINK is a 73 y.o. male with medical history significant of hypertension, hyperlipidemia, vertigo who presents to the emergency department from home via EMS due to fall sustained at home which was thought to be due to generalized weakness and dizziness.  Patient states that he recently had some diarrhea.  EMS was activated on arrival of EMS seen, patient was noted to have a temperature of 103F orally PTA.  Patient lives at home with wife.  ED Course:  In the ED, he was febrile with a temperature of 102.44F, BP 154/65, other vital signs were within normal range.  Workup in the ED showed normocytic anemia and BMP showed sodium 131, potassium 3.7, chloride 101, bicarb 20, blood glucose 134, BUN/creatinine 43/2.12 (baseline creatinine at 1.2), lactic acid was normal, magnesium 2.3, urinalysis was suggestive of UTI.  Influenza A, B, SARS (2, RSV was negative. Chest x-ray showed no active disease He was treated with IV cefepime, metronidazole, vancomycin due to fever of unknown origin.  IV hydration was provided.  TRH was asked to admit.  Review of Systems: Review of systems as noted in the HPI. All other systems reviewed and are negative.   Past Medical History:  Diagnosis Date   Diabetes mellitus without complication (HCC)    Hyperlipidemia    Hypertension    History reviewed. No pertinent surgical history.  Social History:  reports that he has been smoking cigarettes. He has a 55 pack-year smoking history. He has never used smokeless tobacco. He reports that he does not currently use alcohol. He reports that he does not use drugs.   No Known Allergies  Family History  Family history unknown:  Yes     Prior to Admission medications   Medication Sig Start Date End Date Taking? Authorizing Provider  acetaminophen  (TYLENOL ) 500 MG tablet Take 500 mg by mouth every 6 (six) hours as needed.    [provider]  amLODipine  (NORVASC ) 5 MG tablet Take 2 tablets by mouth once daily 01/12/24   Zarwolo, Gloria, FNP  aspirin 81 MG tablet 81 mg.    [provider]  Chromium Picolinate (CHROMIUM PICOLATE PO) Take by mouth.    [provider]  Ensure (ENSURE) Take 237 mLs by mouth 3 (three) times daily with meals. Patient not taking: Reported on 10/11/2023 09/15/21   Paseda, Folashade R, FNP  lovastatin  (MEVACOR ) 40 MG tablet Take 1 tablet (40 mg total) by mouth at bedtime. 08/11/22   Zarwolo, Gloria, FNP  meclizine  (ANTIVERT ) 25 MG tablet TAKE 1 TABLET BY MOUTH THREE TIMES DAILY 01/12/24   Zarwolo, Gloria, FNP  olmesartan  (BENICAR ) 20 MG tablet Take 1 tablet by mouth once daily 01/12/24   Zarwolo, Gloria, FNP    Physical Exam: BP (!) 158/61   Pulse (!) 101   Temp (!) 103.1 F (39.5 C) (Oral)   Resp 17   Ht 6\' 2"  (1.88 m)   Wt 74.6 kg   SpO2 94%   BMI 21.12 kg/m   General: 73 y.o. year-old male well developed well nourished in no acute distress.  Alert and oriented x3. HEENT: NCAT, EOMI, dry mucous membrane Neck: Supple, trachea medial Cardiovascular: Regular rate and rhythm with  no rubs or gallops.  No thyromegaly or JVD noted.  No lower extremity edema. 2/4 pulses in all 4 extremities. Respiratory: Clear to auscultation with no wheezes or rales. Good inspiratory effort. Abdomen: Soft, nontender nondistended with normal bowel sounds x4 quadrants. Muskuloskeletal: No cyanosis, clubbing or edema noted bilaterally Neuro: CN II-XII intact, strength 5/5 x 4, sensation, reflexes intact Skin: No ulcerative lesions noted or rashes Psychiatry: Judgement and insight appear normal. Mood is appropriate for condition and setting          Labs on Admission:  Basic Metabolic  Panel: Recent Labs  Lab 01/17/24 2236  NA 131*  K 3.7  CL 101  CO2 20*  GLUCOSE 134*  BUN 43*  CREATININE 2.12*  CALCIUM 8.5*  MG 2.2   Liver Function Tests: Recent Labs  Lab 01/17/24 2236  AST 19  ALT 14  ALKPHOS 58  BILITOT 0.7  PROT 8.0  ALBUMIN 2.9*   No results for input(s): "LIPASE", "AMYLASE" in the last 168 hours. No results for input(s): "AMMONIA" in the last 168 hours. CBC: Recent Labs  Lab 01/17/24 2236  WBC 9.2  NEUTROABS 7.2  HGB 11.0*  HCT 32.4*  MCV 87.8  PLT 322   Cardiac Enzymes: No results for input(s): "CKTOTAL", "CKMB", "CKMBINDEX", "TROPONINI" in the last 168 hours.  BNP (last 3 results) No results for input(s): "BNP" in the last 8760 hours.  ProBNP (last 3 results) No results for input(s): "PROBNP" in the last 8760 hours.  CBG: No results for input(s): "GLUCAP" in the last 168 hours.  Radiological Exams on Admission: DG Chest Port 1 View Result Date: 01/17/2024 CLINICAL DATA:  Questionable sepsis-evaluate for abnormality EXAM: PORTABLE CHEST 1 VIEW COMPARISON:  04/27/2010 FINDINGS: Stable cardiomediastinal silhouette. Aortic atherosclerotic calcification. No focal consolidation, pleural effusion, or pneumothorax. No displaced rib fractures. IMPRESSION: No active disease. Electronically Signed   By: Rozell Cornet M.D.   On: 01/17/2024 22:46    EKG: I independently viewed the EKG done and my findings are as followed: Sinus tachycardia at a rate of 103 bpm  Assessment/Plan Present on Admission:  UTI (urinary tract infection)  Essential hypertension  Mixed hyperlipidemia  Principal Problem:   UTI (urinary tract infection) Active Problems:   Essential hypertension   Mixed hyperlipidemia   AKI (acute kidney injury) (HCC)   Dehydration   Hyponatremia   Hypoalbuminemia due to protein-calorie malnutrition (HCC)  UTI POA Patient was empirically treated with IV cefepime, metronidazole and vancomycin due to acute febrile illness,  but this was subsequently suspected to be due to UTI Continue IV ceftriaxone Continue Tylenol  as needed for fever Blood culture and urine culture pending  Acute kidney injury Dehydration BUN/creatinine 43/2.23 (baseline creatinine at 1.2). Continue gentle hydration Renally adjust medications, avoid nephrotoxic agents/dehydration/hypotension  Hyponatremia This is possibly due to dehydration Continue gentle hydration Continue to monitor sodium with serial BMPs Urine osmolality, serum osmolality and urine sodium will be checked  Hypoalbuminemia possibly secondary to moderate protein calorie malnutrition Albumin 2.9, protein supplement will be provided  Essential hypertension Continue amlodipine , Avapro  Mixed hyperlipidemia Continue Pravachol   DVT prophylaxis: Lovenox  Code Status: Full code  Family Communication: None at bedside  Consults: None   Severity of Illness: The appropriate patient status for this patient is INPATIENT. Inpatient status is judged to be reasonable and necessary in order to provide the required intensity of service to ensure the patient's safety. The patient's presenting symptoms, physical exam findings, and initial radiographic and laboratory data  in the context of their chronic comorbidities is felt to place them at high risk for further clinical deterioration. Furthermore, it is not anticipated that the patient will be medically stable for discharge from the hospital within 2 midnights of admission.   * I certify that at the point of admission it is my clinical judgment that the patient will require inpatient hospital care spanning beyond 2 midnights from the point of admission due to high intensity of service, high risk for further deterioration and high frequency of surveillance required.*  Author: Lillyan Hitson, DO 01/18/2024 5:16 AM  For on call review [email or web address].

## 2024-01-18 NOTE — TOC CM/SW Note (Signed)
 Transition of Care [facility]) - Inpatient Brief Assessment   Patient Details  Name: Norman Campbell MRN: [id number] Date of Birth: 03-Jun-1951  Transition of Care [facility] CM/SW Contact:    Grandville Lax, LCSWA Phone Number: 01/18/2024, 8:22 AM   Clinical Narrative: Transition of Care Department [facility]) has reviewed patient and no TOC needs have been identified at this time. We will continue to monitor patient advancement through interdiciplinary progression rounds. If new patient transition needs arise, please place a TOC consult.   Transition of Care Asessment: Insurance and Status: Insurance coverage has been reviewed Patient has primary care physician: Yes Home environment has been reviewed: From home Prior level of function:: Independent Prior/Current Home Services: No current home services Social Drivers of Health Review: SDOH reviewed no interventions necessary Readmission risk has been reviewed: Yes Transition of care needs: no transition of care needs at this time

## 2024-01-18 NOTE — ED Notes (Signed)
 Bag of shows and cell phone in bag going with transport to room.

## 2024-01-18 NOTE — Progress Notes (Addendum)
 CRITICAL VALUE STICKER  CRITICAL VALUE: Positive blood cultures: Anaerobic bottle positive for gram negative rods   RECEIVER (on-site recipient of call): Vickye Grant, RN   DATE & TIME NOTIFIED: 01/18/2024 at 1030  MESSENGER (representative from lab): Ebb Goldman  MD NOTIFIED: Dr. Bobbetta Burnet  TIME OF NOTIFICATION: 01/17/24 at 1049  RESPONSE: added pharmacy to chat Arlyne Bering and Alvah Auerbach.) See new orders

## 2024-01-18 NOTE — Hospital Course (Addendum)
 73 yom w/ HTN HLD, vertigo who presented to the ED via EMS due to fall sustained at home which was thought to be due to generalized weakness and dizziness.  Patient stated he recently had some diarrhea, EMS was activated was noted to have a temperature of 103F  In ED: febrile with a temperature of 102.72F, BP 154/65,  labs-normocytic anemia and BMP showed sodium 131, potassium 3.7, chloride 101, bicarb 20, blood glucose134, BUN/creatinine 43/2.12 (baseline creatinine at 1.2), lactate normal, magnesium 2.3, UA-suggestive of UTI.  Influenza A, B, SARS-2, RSV was negative. CXR:no active disease Urine culture blood culture sent,given IV cefepime , metronidazole , vancomycin  ivf and admitted for further management  Patient urine culture grew pansensitive E. coli, CT scan showed mild acute right pyonephritis, patient had E. coli bacteremia At this time he is clinically improved AKI resolved vitals stable and afebrile  Subjective/on exam: Seen examined this morning Alert awake resting comfortably.  No complaint Overnight afebrile BP stable creatinine has improved to 1.0 CBC stable  No new complaints  Discharge diagnosis :  Acute right pyelonephritis  and UTI  2/2 E coliPOA E. coli bacteremia and sepsis POA: On admit-fever 102 heart rate 103, normal lactic acid and WBC count. Urine culture with pansensitive E. Coli.  Blood culture with E. coli  abnormal CT renal w/ mild right pyelonephritis  Cxr no PNA.  Patient has been afebrile now overall stable Once blood culture finalized we will discharge on oral antibiotics, and doxycycline 1 g twice daily to complete 10 days course I  discussed with Dr. Zelda Hickman from ID for antibiotic medicine for discharge home   AKI  Due to pyelonephritis and infection, resolved   5 mm RML nodule: With longstanding smoking history high risk  and he will need CT chest in 12 months-patient was very clearly instructed, nursing at bedside I have made ambulatory referral to pulm  as well.  He is advised to follow-up with PCP  Anemia of chronic disease: Hb overall stable will need close follow-up and monitoring as outpatient and further workup Although previous hemoglobin 12 g in 2023, he will need close follow-up with PCP to r/o other etiology  Hypovolemic hyponatremia: Improved with IV fluids.    Diarrhea: Stool studies negative C. difficile, GI panel pending   Hypoalbuminemia Augment diet  Essential hypertension: BP stable. Hold off on amlodipine  Avapro  for now consider resuming slowly as outpatient  Mixed hyperlipidemia Continue Pravachol 

## 2024-01-19 ENCOUNTER — Telehealth: Payer: Self-pay | Admitting: Family Medicine

## 2024-01-19 DIAGNOSIS — N10 Acute tubulo-interstitial nephritis: Secondary | ICD-10-CM | POA: Diagnosis not present

## 2024-01-19 DIAGNOSIS — R911 Solitary pulmonary nodule: Secondary | ICD-10-CM | POA: Insufficient documentation

## 2024-01-19 LAB — BASIC METABOLIC PANEL WITH GFR
Anion gap: 8 (ref 5–15)
BUN: 36 mg/dL — ABNORMAL HIGH (ref 8–23)
CO2: 22 mmol/L (ref 22–32)
Calcium: 8.3 mg/dL — ABNORMAL LOW (ref 8.9–10.3)
Chloride: 105 mmol/L (ref 98–111)
Creatinine, Ser: 1.42 mg/dL — ABNORMAL HIGH (ref 0.61–1.24)
GFR, Estimated: 53 mL/min — ABNORMAL LOW (ref >60)
Glucose, Bld: 92 mg/dL (ref 70–99)
Potassium: 3.6 mmol/L (ref 3.5–5.1)
Sodium: 135 mmol/L (ref 135–145)

## 2024-01-19 LAB — CBC
HCT: 28.6 % — ABNORMAL LOW (ref 39.0–52.0)
Hemoglobin: 9.5 g/dL — ABNORMAL LOW (ref 13.0–17.0)
MCH: 29.1 pg (ref 26.0–34.0)
MCHC: 33.2 g/dL (ref 30.0–36.0)
MCV: 87.7 fL (ref 80.0–100.0)
Platelets: 272 10*3/uL (ref 150–400)
RBC: 3.26 MIL/uL — ABNORMAL LOW (ref 4.22–5.81)
RDW: 13.2 % (ref 11.5–15.5)
WBC: 7 10*3/uL (ref 4.0–10.5)
nRBC: 0 % (ref 0.0–0.2)

## 2024-01-19 LAB — C DIFFICILE QUICK SCREEN W PCR REFLEX
C Diff antigen: NEGATIVE
C Diff interpretation: NOT DETECTED
C Diff toxin: NEGATIVE

## 2024-01-19 NOTE — Telephone Encounter (Signed)
 Patient in the hospital.  Copied from CRM [id number]. Topic: Appointments - Appointment Cancel/Reschedule >> Jan 19, 2024 12:01 PM Oddis Bench wrote: Patient/patient representative is calling to cancel or an appointment. Refer to attachments for appointment information.

## 2024-01-19 NOTE — Plan of Care (Signed)
  Problem: Education: Goal: Knowledge of General Education information will improve Description: Including pain rating scale, medication(s)/side effects and non-pharmacologic comfort measures Outcome: Progressing   Problem: Health Behavior/Discharge Planning: Goal: Ability to manage health-related needs will improve Outcome: Progressing   Problem: Clinical Measurements: Goal: Ability to maintain clinical measurements within normal limits will improve Outcome: Progressing Goal: Will remain free from infection Outcome: Progressing Goal: Diagnostic test results will improve Outcome: Progressing Goal: Cardiovascular complication will be avoided Outcome: Progressing   Problem: Elimination: Goal: Will not experience complications related to bowel motility Outcome: Progressing Goal: Will not experience complications related to urinary retention Outcome: Progressing   Problem: Activity: Goal: Risk for activity intolerance will decrease Outcome: Progressing   Problem: Pain Managment: Goal: General experience of comfort will improve and/or be controlled Outcome: Progressing

## 2024-01-19 NOTE — Progress Notes (Signed)
 PROGRESS NOTE Norman Campbell  ZOX:[id number] DOB: [birth year] DOA: 01/17/2024 PCP: Zarwolo, Gloria, FNP  Brief Narrative/Hospital Course: 68 yom w/ HTN HLD, vertigo who presented to the ED via EMS due to fall sustained at home which was thought to be due to generalized weakness and dizziness.  Patient stated he recently had some diarrhea, EMS was activated was noted to have a temperature of 103F  In ED: febrile with a temperature of 102.31F, BP 154/65,  labs-normocytic anemia and BMP showed sodium 131, potassium 3.7, chloride 101, bicarb 20, blood glucose134, BUN/creatinine 43/2.12 (baseline creatinine at 1.2), lactate normal, magnesium 2.3, UA-suggestive of UTI.  Influenza A, B, SARS-2, RSV was negative. CXR:no active disease Urine culture blood culture sent,given IV cefepime, metronidazole, vancomycin ivf and admitted for further management   Subjective/on exam: Seen and examined Patient reports he feels fine this morning Overnight still having low-grade temperature Tmax one 1.1 yesterday 4 PM  On room air BP stable.  Labs remains stable creatinine improving 1.4  Assessment/Plan:   Acute right pyelonephritis  and UTI POA GNR bacteremia and sepsis POA: On admit-fever 102 heart rate 103, normal lactic acid and WBC count.  UA grossly abnormal CT renal w/ mild right pyelonephritis  Cxr no PNA.  Having intermittent fever, blood culture positive BC ID with E. Coli Continue ceftriaxone 2 gm daily, follow-up culture data  AKI  B/l creat 1.1 in 09/17/22, in the setting of pyelonephritis UTI creatinine improving, continue to encourage oral hydration, Avoid nephrotoxic medication, renally dose meds   5 mm RML nodule: With longstanding smoking history high risk  and he will need CT chest in 12 months-patient was very clearly instructed, nursing at bedside I will make ambulatory referral as well.  He is advised to follow-up with PCP  Anemia of chronic disease: Hemoglobin holding in the 9 g range,  monitor Although previous hemoglobin 12 g in 2023, he will need close follow-up with PCP to r/o other etiology  Hypovolemic hyponatremia: Improved with IV fluids.    Diarrhea: Stool studies pending but still becoming more formed now.  Hypoalbuminemia R/o malnutrition.   Essential hypertension: BP stable. Hold off on amlodipine  Avapro   Mixed hyperlipidemia Continue Pravachol   DVT prophylaxis: enoxaparin (LOVENOX) injection 40 mg Start: 01/18/24 1000 SCDs Start: 01/18/24 0418 Code Status:   Code Status: Full Code Family Communication: plan of care discussed with patient at bedside. Patient status is: Remains hospitalized because of severity of illness Level of care: Med-Surg   Dispo: The patient is from: home w. Wife            Anticipated disposition: TBD Objective: Vitals last 24 hrs: Vitals:   01/18/24 1608 01/18/24 2155 01/18/24 2330 01/19/24 0500  BP: 126/60 111/62 118/66 130/67  Pulse: 80 80 77 81  Resp:  18 18 16   Temp: (!) 101.1 F (38.4 C) 99.7 F (37.6 C) 99.2 F (37.3 C) 100 F (37.8 C)  TempSrc: Oral Oral Oral Oral  SpO2: 99% 98% 99% 100%  Weight:      Height:        Physical Examination: General exam: alert awake, older than stated age HEENT:Oral mucosa moist, Ear/Nose WNL grossly Respiratory system: Bilaterally clear BS, no use of accessory muscle Cardiovascular system: S1 & S2 +. Gastrointestinal system: Abdomen soft, NT,ND,BS+ Nervous System: Alert, awake, following commands. Extremities: LE edema neg, warm extremities Skin: No rashes,warm. MSK: Normal muscle bulk/tone.   Data Reviewed: I have personally reviewed following labs and imaging studies (  see epic result tab) CBC: Recent Labs  Lab 01/17/24 2236 01/18/24 0528 01/19/24 0451  WBC 9.2 8.7 7.0  NEUTROABS 7.2  --   --   HGB 11.0* 9.9* 9.5*  HCT 32.4* 30.2* 28.6*  MCV 87.8 86.3 87.7  PLT 322 261 272   CMP: Recent Labs  Lab 01/17/24 2236 01/18/24 0528 01/19/24 0451  NA  131* 133* 135  K 3.7 3.9 3.6  CL 101 103 105  CO2 20* 21* 22  GLUCOSE 134* 132* 92  BUN 43* 40* 36*  CREATININE 2.12* 1.68* 1.42*  CALCIUM 8.5* 8.2* 8.3*  MG 2.2 2.1  --   PHOS  --  2.5  --    GFR: Estimated Creatinine Clearance: 48.6 mL/min (A) (by C-G formula based on SCr of 1.42 mg/dL (H)). Recent Labs  Lab 01/17/24 2236 01/18/24 0528  AST 19 20  ALT 14 14  ALKPHOS 58 50  BILITOT 0.7 0.7  PROT 8.0 7.1  ALBUMIN 2.9* 2.5*   No results for input(s): "LIPASE", "AMYLASE" in the last 168 hours. No results for input(s): "AMMONIA" in the last 168 hours. Coagulation Profile: No results for input(s): "INR", "PROTIME" in the last 168 hours. Unresulted Labs (From admission, onward)     Start     Ordered   01/25/24 0500  Creatinine, serum  (enoxaparin (LOVENOX)    CrCl >/= 30 ml/min)  Weekly,   R     Comments: while on enoxaparin therapy    01/18/24 0418   01/19/24 0500  Basic metabolic panel with GFR  Daily,   R      01/18/24 0757   01/19/24 0500  CBC  Daily,   R      01/18/24 0757   01/18/24 0918  Gastrointestinal Panel by PCR , Stool  (Gastrointestinal Panel by PCR, Stool                                                                                                                                                     **Does Not include CLOSTRIDIUM DIFFICILE testing. **If CDIFF testing is needed, place order from the "C Difficile Testing" order set.**)  Once,   R        01/18/24 0917           Antimicrobials/Microbiology: Anti-infectives (From admission, onward)    Start     Dose/Rate Route Frequency Ordered Stop   01/19/24 1000  cefTRIAXone (ROCEPHIN) 2 g in sodium chloride  0.9 % 100 mL IVPB        2 g 200 mL/hr over 30 Minutes Intravenous Every 24 hours 01/18/24 1052     01/18/24 1200  cefTRIAXone (ROCEPHIN) 1 g in sodium chloride  0.9 % 100 mL IVPB        1 g 200 mL/hr over 30 Minutes Intravenous  Once 01/18/24 1052 01/18/24  1217   01/18/24 1000  cefTRIAXone (ROCEPHIN) 1  g in sodium chloride  0.9 % 100 mL IVPB  Status:  Discontinued        1 g 200 mL/hr over 30 Minutes Intravenous Every 24 hours 01/18/24 0440 01/18/24 1052   01/17/24 2230  ceFEPIme (MAXIPIME) 2 g in sodium chloride  0.9 % 100 mL IVPB        2 g 200 mL/hr over 30 Minutes Intravenous  Once 01/17/24 2224 01/17/24 2324   01/17/24 2230  metroNIDAZOLE (FLAGYL) IVPB 500 mg        500 mg 100 mL/hr over 60 Minutes Intravenous  Once 01/17/24 2224 01/18/24 0001   01/17/24 2230  vancomycin (VANCOCIN) IVPB 1000 mg/200 mL premix        1,000 mg 200 mL/hr over 60 Minutes Intravenous  Once 01/17/24 2224 01/18/24 0110         Component Value Date/Time   SDES  01/17/2024 2312    URINE, RANDOM Performed at [facility], [address] [place], [place] [address]    SPECREQUEST  01/17/2024 2312    NONE Reflexed from [phone] [phone] Performed at [facility], [address], [place], [place] [address]    CULT (A) 01/17/2024 2312    >=100,000 COLONIES/mL ESCHERICHIA COLI SUSCEPTIBILITIES TO FOLLOW Performed at [facility], [address], [place], [place] [address]    REPTSTATUS PENDING 01/17/2024 2312   Medications reviewed:  Scheduled Meds:  enoxaparin (LOVENOX) injection  40 mg Subcutaneous Q24H   feeding supplement (GLUCERNA SHAKE)  237 mL Oral TID BM   pravastatin  40 mg Oral q1800   Continuous Infusions:  cefTRIAXone (ROCEPHIN)  IV 2 g (01/19/24 0936)   Lesa Rape, MD [facility] 01/19/2024, 10:21 AM

## 2024-01-20 ENCOUNTER — Ambulatory Visit: Payer: Self-pay | Admitting: Family Medicine

## 2024-01-20 DIAGNOSIS — N10 Acute tubulo-interstitial nephritis: Secondary | ICD-10-CM | POA: Diagnosis not present

## 2024-01-20 LAB — CULTURE, BLOOD (ROUTINE X 2): Special Requests: ADEQUATE

## 2024-01-20 LAB — GASTROINTESTINAL PANEL BY PCR, STOOL (REPLACES STOOL CULTURE)

## 2024-01-20 LAB — URINE CULTURE: Culture: >=100000 — AB

## 2024-01-20 LAB — CBC
HCT: 28.8 % — ABNORMAL LOW (ref 39.0–52.0)
Hemoglobin: 9.4 g/dL — ABNORMAL LOW (ref 13.0–17.0)
MCH: 28.5 pg (ref 26.0–34.0)
MCHC: 32.6 g/dL (ref 30.0–36.0)
MCV: 87.3 fL (ref 80.0–100.0)
Platelets: 263 10*3/uL (ref 150–400)
RBC: 3.3 MIL/uL — ABNORMAL LOW (ref 4.22–5.81)
RDW: 13.4 % (ref 11.5–15.5)
WBC: 4.3 10*3/uL (ref 4.0–10.5)
nRBC: 0 % (ref 0.0–0.2)

## 2024-01-20 LAB — BASIC METABOLIC PANEL WITH GFR
Anion gap: 7 (ref 5–15)
BUN: 36 mg/dL — ABNORMAL HIGH (ref 8–23)
CO2: 24 mmol/L (ref 22–32)
Calcium: 8.2 mg/dL — ABNORMAL LOW (ref 8.9–10.3)
Chloride: 106 mmol/L (ref 98–111)
Creatinine, Ser: 1.09 mg/dL (ref 0.61–1.24)
GFR, Estimated: >60 mL/min (ref >60)
Glucose, Bld: 98 mg/dL (ref 70–99)
Potassium: 3.7 mmol/L (ref 3.5–5.1)
Sodium: 137 mmol/L (ref 135–145)

## 2024-01-20 MED ORDER — AMOXICILLIN 500 MG PO TABS: 1000.0000 mg | ORAL_TABLET | Freq: Two times a day (BID) | ORAL | 0 refills | Status: AC

## 2024-01-20 NOTE — Plan of Care (Signed)
   Problem: Education: Goal: Knowledge of General Education information will improve Description Including pain rating scale, medication(s)/side effects and non-pharmacologic comfort measures Outcome: Progressing   Problem: Health Behavior/Discharge Planning: Goal: Ability to manage health-related needs will improve Outcome: Progressing

## 2024-01-20 NOTE — Plan of Care (Signed)

## 2024-01-20 NOTE — Discharge Summary (Signed)
 Physician Discharge Summary  SUFIYAN MCGAFFIGAN WUJ:[id number] DOB: Jul 01, 1951 DOA: 01/17/2024  PCP: Zarwolo, Gloria, FNP  Admit date: 01/17/2024 Discharge date: 01/20/2024 Recommendations for Outpatient Follow-up:  Follow up with PCP in 1 weeks-call for appointment Please obtain BMP/CBC in one week  Discharge Dispo: Home Discharge Condition: Stable Code Status:   Code Status: Full Code Diet recommendation:  Diet Order             Diet heart healthy/carb modified Room service appropriate? Yes; Fluid consistency: Thin  Diet effective now                    Brief/Interim Summary: 89 yom w/ HTN HLD, vertigo who presented to the ED via EMS due to fall sustained at home which was thought to be due to generalized weakness and dizziness.  Patient stated he recently had some diarrhea, EMS was activated was noted to have a temperature of 103F  In ED: febrile with a temperature of 102.6F, BP 154/65,  labs-normocytic anemia and BMP showed sodium 131, potassium 3.7, chloride 101, bicarb 20, blood glucose134, BUN/creatinine 43/2.12 (baseline creatinine at 1.2), lactate normal, magnesium 2.3, UA-suggestive of UTI.  Influenza A, B, SARS-2, RSV was negative. CXR:no active disease Urine culture blood culture sent,given IV cefepime , metronidazole , vancomycin  ivf and admitted for further management  Patient urine culture grew pansensitive E. coli, CT scan showed mild acute right pyonephritis, patient had E. coli bacteremia At this time he is clinically improved AKI resolved vitals stable and afebrile  Subjective/on exam: Seen examined this morning Alert awake resting comfortably.  No complaint Overnight afebrile BP stable creatinine has improved to 1.0 CBC stable  No new complaints  Discharge diagnosis :  Acute right pyelonephritis  and UTI  2/2 E coliPOA E. coli bacteremia and sepsis POA: On admit-fever 102 heart rate 103, normal lactic acid and WBC count. Urine culture with pansensitive E.  Coli.  Blood culture with E. coli  abnormal CT renal w/ mild right pyelonephritis  Cxr no PNA.  Patient has been afebrile now overall stable Once blood culture finalized we will discharge on oral antibiotics, and doxycycline 1 g twice daily to complete 10 days course I  discussed with Dr. Zelda Hickman from ID for antibiotic medicine for discharge home   AKI  Due to pyelonephritis and infection, resolved   5 mm RML nodule: With longstanding smoking history high risk  and he will need CT chest in 12 months-patient was very clearly instructed, nursing at bedside I have made ambulatory referral to pulm as well.  He is advised to follow-up with PCP  Anemia of chronic disease: Hb overall stable will need close follow-up and monitoring as outpatient and further workup Although previous hemoglobin 12 g in 2023, he will need close follow-up with PCP to r/o other etiology  Hypovolemic hyponatremia: Improved with IV fluids.    Diarrhea: Stool studies negative C. difficile, GI panel pending   Hypoalbuminemia Augment diet  Essential hypertension: BP stable. Hold off on amlodipine  Avapro  for now consider resuming slowly as outpatient  Mixed hyperlipidemia Continue Pravachol   Discharge Exam: Vitals:   01/19/24 2114 01/20/24 0436  BP: 118/63 123/64  Pulse: 70 68  Resp: 18 18  Temp: 98.4 F (36.9 C) 98.4 F (36.9 C)  SpO2: 100% 100%   General: Pt is alert, awake, not in acute distress Cardiovascular: RRR, S1/S2 +, no rubs, no gallops Respiratory: CTA bilaterally, no wheezing, no rhonchi Abdominal: Soft, NT, ND, bowel sounds + Extremities: no  edema, no cyanosis  Discharge Instructions  Discharge Instructions     Discharge instructions   Complete by: As directed    Please call call MD or return to ER for similar or worsening recurring problem that brought you to hospital or if any fever,nausea/vomiting,abdominal pain, uncontrolled pain, chest pain,  shortness of breath or any other  alarming symptoms.  Please follow-up your doctor as instructed in a week time and call the office for appointment.  Please avoid alcohol, smoking, or any other illicit substance and maintain healthy habits including taking your regular medications as prescribed.  You were cared for by a hospitalist during your hospital stay. If you have any questions about your discharge medications or the care you received while you were in the hospital after you are discharged, you can call the unit and ask to speak with the hospitalist on call if the hospitalist that took care of you is not available.  Once you are discharged, your primary care physician will handle any further medical issues. Please note that NO REFILLS for any discharge medications will be authorized once you are discharged, as it is imperative that you return to your primary care physician (or establish a relationship with a primary care physician if you do not have one) for your aftercare needs so that they can reassess your need for medications and monitor your lab values   Increase activity slowly   Complete by: As directed    Pulmonary Visit   Complete by: As directed    Reason for referral: Lung Mass/Lung Nodule   Does the patient's CT scan have any urgent finds? ("lung mass" (3+cm), suspected "metastatic disease," nodules >65mm with high-risk characteristics such as "spiculation, pleural tenting, adenopathy," or non-calcified nodule >11mm): No      Allergies as of 01/20/2024   No Known Allergies      Medication List     STOP taking these medications    amLODipine  5 MG tablet Commonly known as: NORVASC    olmesartan  20 MG tablet Commonly known as: BENICAR        TAKE these medications    amoxicillin 500 MG tablet Commonly known as: AMOXIL Take 2 tablets (1,000 mg total) by mouth 2 (two) times daily for 8 days.   aspirin 81 MG tablet Take 81 mg by mouth daily.   CHROMIUM PICOLATE PO Take 1 tablet by mouth daily.    lovastatin  40 MG tablet Commonly known as: MEVACOR  Take 1 tablet (40 mg total) by mouth at bedtime.   meclizine  25 MG tablet Commonly known as: ANTIVERT  TAKE 1 TABLET BY MOUTH THREE TIMES DAILY        Follow-up Information     Zarwolo, Gloria, FNP Follow up in 1 week(s).   Specialty: Family Medicine Contact information: [address] [place] [address] [phone]                No Known Allergies  The results of significant diagnostics from this hospitalization (including imaging, microbiology, ancillary and laboratory) are listed below for reference.    Microbiology: Recent Results (from the past 240 hours)  Resp panel by RT-PCR (RSV, Flu A&B, Covid) Anterior Nasal Swab     Status: None   Collection Time: 01/17/24 10:36 PM   Specimen: Anterior Nasal Swab  Result Value Ref Range Status   SARS Coronavirus 2 by RT PCR NEGATIVE NEGATIVE Final    Comment: (NOTE) SARS-CoV-2 target nucleic acids are NOT DETECTED.  The SARS-CoV-2 RNA is generally  detectable in upper respiratory specimens during the acute phase of infection. The lowest concentration of SARS-CoV-2 viral copies this assay can detect is 138 copies/mL. A negative result does not preclude SARS-Cov-2 infection and should not be used as the sole basis for treatment or other patient management decisions. A negative result may occur with  improper specimen collection/handling, submission of specimen other than nasopharyngeal swab, presence of viral mutation(s) within the areas targeted by this assay, and inadequate number of viral copies(<138 copies/mL). A negative result must be combined with clinical observations, patient history, and epidemiological information. The expected result is Negative.  Fact Sheet for Patients:  BloggerCourse.com  Fact Sheet for Healthcare Providers:  SeriousBroker.it  This test is no t yet approved or cleared by the  [place]  FDA and  has been authorized for detection and/or diagnosis of SARS-CoV-2 by FDA under an Emergency Use Authorization (EUA). This EUA will remain  in effect (meaning this test can be used) for the duration of the COVID-19 declaration under Section 564(b)(1) of the Act, 21 U.S.C.section 360bbb-3(b)(1), unless the authorization is terminated  or revoked sooner.       Influenza A by PCR NEGATIVE NEGATIVE Final   Influenza B by PCR NEGATIVE NEGATIVE Final    Comment: (NOTE) The Xpert Xpress SARS-CoV-2/FLU/RSV plus assay is intended as an aid in the diagnosis of influenza from Nasopharyngeal swab specimens and should not be used as a sole basis for treatment. Nasal washings and aspirates are unacceptable for Xpert Xpress SARS-CoV-2/FLU/RSV testing.  Fact Sheet for Patients: BloggerCourse.com  Fact Sheet for Healthcare Providers: SeriousBroker.it  This test is not yet approved or cleared by the [place]  FDA and has been authorized for detection and/or diagnosis of SARS-CoV-2 by FDA under an Emergency Use Authorization (EUA). This EUA will remain in effect (meaning this test can be used) for the duration of the COVID-19 declaration under Section 564(b)(1) of the Act, 21 U.S.C. section 360bbb-3(b)(1), unless the authorization is terminated or revoked.     Resp Syncytial Virus by PCR NEGATIVE NEGATIVE Final    Comment: (NOTE) Fact Sheet for Patients: BloggerCourse.com  Fact Sheet for Healthcare Providers: SeriousBroker.it  This test is not yet approved or cleared by the [place]  FDA and has been authorized for detection and/or diagnosis of SARS-CoV-2 by FDA under an Emergency Use Authorization (EUA). This EUA will remain in effect (meaning this test can be used) for the duration of the COVID-19 declaration under Section 564(b)(1) of the Act, 21 U.S.C. section  360bbb-3(b)(1), unless the authorization is terminated or revoked.  Performed at [facility], [address], [place], [place] [address]   Blood Culture (routine x 2)     Status: Abnormal   Collection Time: 01/17/24 10:48 PM   Specimen: BLOOD  Result Value Ref Range Status   Specimen Description   Final    BLOOD LEFT ANTECUBITAL Performed at [facility], [address] [place], [place] [address]    Special Requests   Final    BOTTLES DRAWN AEROBIC AND ANAEROBIC Blood Culture adequate volume Performed at [facility], [address], [place], [place] [address]    Culture  Setup Time   Final    GRAM NEGATIVE RODS IN BOTH AEROBIC AND ANAEROBIC BOTTLES Gram Stain Report Called to,Read Back By and Verified With: M. KATES LAND RN 01/18/24 @1014  BY J. WHITE GRAM STAIN REVIEWED-AGREE WITH RESULT DRT CRITICAL RESULT CALLED TO, READ BACK BY AND VERIFIED WITH: RN STEVEN HURTH ON 01/18/24 @ 1539  BY DRT Performed at [facility], [address] [place], [place] [address]    Culture ESCHERICHIA COLI (A)  Final   Report Status 01/20/2024 FINAL  Final   Organism ID, Bacteria ESCHERICHIA COLI  Final   Organism ID, Bacteria ESCHERICHIA COLI  Final      Susceptibility   Escherichia coli - KIRBY BAUER*    CEFAZOLIN INTERMEDIATE Intermediate    Escherichia coli - MIC*    AMPICILLIN 4 SENSITIVE Sensitive     CEFEPIME  <=0.12 SENSITIVE Sensitive     CEFTAZIDIME <=1 SENSITIVE Sensitive     CEFTRIAXONE  <=0.25 SENSITIVE Sensitive     CIPROFLOXACIN <=0.25 SENSITIVE Sensitive     GENTAMICIN <=1 SENSITIVE Sensitive     IMIPENEM <=0.25 SENSITIVE Sensitive     TRIMETH/SULFA <=20 SENSITIVE Sensitive     AMPICILLIN/SULBACTAM <=2 SENSITIVE Sensitive     PIP/TAZO <=4 SENSITIVE Sensitive ug/mL    * ESCHERICHIA COLI    ESCHERICHIA COLI  Blood Culture ID Panel (Reflexed)     Status: Abnormal   Collection Time: 01/17/24 10:48 PM  Result Value Ref Range Status   Enterococcus faecalis NOT DETECTED NOT  DETECTED Final   Enterococcus Faecium NOT DETECTED NOT DETECTED Final   Listeria monocytogenes NOT DETECTED NOT DETECTED Final   Staphylococcus species NOT DETECTED NOT DETECTED Final   Staphylococcus aureus (BCID) NOT DETECTED NOT DETECTED Final   Staphylococcus epidermidis NOT DETECTED NOT DETECTED Final   Staphylococcus lugdunensis NOT DETECTED NOT DETECTED Final   Streptococcus species NOT DETECTED NOT DETECTED Final   Streptococcus agalactiae NOT DETECTED NOT DETECTED Final   Streptococcus pneumoniae NOT DETECTED NOT DETECTED Final   Streptococcus pyogenes NOT DETECTED NOT DETECTED Final   A.calcoaceticus-baumannii NOT DETECTED NOT DETECTED Final   Bacteroides fragilis NOT DETECTED NOT DETECTED Final   Enterobacterales DETECTED (A) NOT DETECTED Final    Comment: Enterobacterales represent a large order of gram negative bacteria, not a single organism. CRITICAL RESULT CALLED TO, READ BACK BY AND VERIFIED WITH: RN STEVEN HURTH ON 01/18/24 @ 1539 BY DRT    Enterobacter cloacae complex NOT DETECTED NOT DETECTED Final   Escherichia coli DETECTED (A) NOT DETECTED Final    Comment: CRITICAL RESULT CALLED TO, READ BACK BY AND VERIFIED WITH: RN STEVEN HURTH ON 01/18/24 @ 1539 BY DRT    Klebsiella aerogenes NOT DETECTED NOT DETECTED Final   Klebsiella oxytoca NOT DETECTED NOT DETECTED Final   Klebsiella pneumoniae NOT DETECTED NOT DETECTED Final   Proteus species NOT DETECTED NOT DETECTED Final   Salmonella species NOT DETECTED NOT DETECTED Final   Serratia marcescens NOT DETECTED NOT DETECTED Final   Haemophilus influenzae NOT DETECTED NOT DETECTED Final   Neisseria meningitidis NOT DETECTED NOT DETECTED Final   Pseudomonas aeruginosa NOT DETECTED NOT DETECTED Final   Stenotrophomonas maltophilia NOT DETECTED NOT DETECTED Final   Candida albicans NOT DETECTED NOT DETECTED Final   Candida auris NOT DETECTED NOT DETECTED Final   Candida glabrata NOT DETECTED NOT DETECTED Final   Candida  krusei NOT DETECTED NOT DETECTED Final   Candida parapsilosis NOT DETECTED NOT DETECTED Final   Candida tropicalis NOT DETECTED NOT DETECTED Final   Cryptococcus neoformans/gattii NOT DETECTED NOT DETECTED Final   CTX-M ESBL NOT DETECTED NOT DETECTED Final   Carbapenem resistance IMP NOT DETECTED NOT DETECTED Final   Carbapenem resistance KPC NOT DETECTED NOT DETECTED Final   Carbapenem resistance NDM NOT DETECTED NOT DETECTED Final   Carbapenem resist OXA 48 LIKE NOT DETECTED  NOT DETECTED Final   Carbapenem resistance VIM NOT DETECTED NOT DETECTED Final    Comment: Performed at [facility], [address] [place], [place] [address]  Blood Culture (routine x 2)     Status: None (Preliminary result)   Collection Time: 01/17/24 10:57 PM   Specimen: BLOOD RIGHT ARM  Result Value Ref Range Status   Specimen Description   Final    BLOOD RIGHT ARM Performed at [facility], [address], [place], [place] [address]    Special Requests   Final    BOTTLES DRAWN AEROBIC AND ANAEROBIC Blood Culture results may not be optimal due to an inadequate volume of blood received in culture bottles   Culture  Setup Time   Final    AEROBIC BOTTLE ONLY GRAM NEGATIVE RODS Gram Stain Report Called to,Read Back By and Verified With: Cyndra Dress @ 1139 ON 01/18/24 C VARNER Performed at [facility], [address], [place], [place] [address]    Culture GRAM NEGATIVE RODS  Final   Report Status PENDING  Incomplete  Urine Culture     Status: Abnormal   Collection Time: 01/17/24 11:12 PM   Specimen: Urine, Random  Result Value Ref Range Status   Specimen Description   Final    URINE, RANDOM Performed at [facility], [address], [place], [place] [address]    Special Requests   Final    NONE Reflexed from [id number] Performed at [facility], [address], [place], [place] [address]    Culture >=100,000 COLONIES/mL ESCHERICHIA COLI (A)  Final   Report Status 01/20/2024 FINAL  Final    Organism ID, Bacteria ESCHERICHIA COLI (A)  Final      Susceptibility   Escherichia coli - MIC*    AMPICILLIN <=2 SENSITIVE Sensitive     CEFAZOLIN <=4 SENSITIVE Sensitive     CEFEPIME  <=0.12 SENSITIVE Sensitive     CEFTRIAXONE  <=0.25 SENSITIVE Sensitive     CIPROFLOXACIN <=0.25 SENSITIVE Sensitive     GENTAMICIN <=1 SENSITIVE Sensitive     IMIPENEM <=0.25 SENSITIVE Sensitive     NITROFURANTOIN <=16 SENSITIVE Sensitive     TRIMETH/SULFA <=20 SENSITIVE Sensitive     AMPICILLIN/SULBACTAM <=2 SENSITIVE Sensitive     PIP/TAZO <=4 SENSITIVE Sensitive ug/mL    * >=100,000 COLONIES/mL ESCHERICHIA COLI  C Difficile Quick Screen w PCR reflex     Status: None   Collection Time: 01/19/24  4:50 AM   Specimen: STOOL  Result Value Ref Range Status   C Diff antigen NEGATIVE NEGATIVE Final   C Diff toxin NEGATIVE NEGATIVE Final   C Diff interpretation No C. difficile detected.  Final    Comment: Performed at [facility], [address] [place], [place] [address]  Gastrointestinal Panel by PCR , Stool     Status: None   Collection Time: 01/19/24  4:50 AM   Specimen: Stool  Result Value Ref Range Status   Campylobacter species NOT DETECTED NOT DETECTED Final   Plesimonas shigelloides NOT DETECTED NOT DETECTED Final   Salmonella species NOT DETECTED NOT DETECTED Final   Yersinia enterocolitica NOT DETECTED NOT DETECTED Final   Vibrio species NOT DETECTED NOT DETECTED Final   Vibrio cholerae NOT DETECTED NOT DETECTED Final   Enteroaggregative E coli (EAEC) NOT DETECTED NOT DETECTED Final   Enteropathogenic E coli (EPEC) NOT DETECTED NOT DETECTED Final   Enterotoxigenic E coli (ETEC) NOT DETECTED NOT DETECTED Final   Shiga like toxin producing E coli (  STEC) NOT DETECTED NOT DETECTED Final   Shigella/Enteroinvasive E coli (EIEC) NOT DETECTED NOT DETECTED Final   Cryptosporidium NOT DETECTED NOT DETECTED Final   Cyclospora cayetanensis NOT DETECTED NOT DETECTED Final   Entamoeba histolytica NOT  DETECTED NOT DETECTED Final   Giardia lamblia NOT DETECTED NOT DETECTED Final   Adenovirus F40/41 NOT DETECTED NOT DETECTED Final   Astrovirus NOT DETECTED NOT DETECTED Final   Norovirus GI/GII NOT DETECTED NOT DETECTED Final   Rotavirus A NOT DETECTED NOT DETECTED Final   Sapovirus (I, II, IV, and V) NOT DETECTED NOT DETECTED Final    Comment: Performed at [facility], [address] [place], [place] [address]    Procedures/Studies: CT RENAL STONE STUDY Result Date: 01/18/2024 CLINICAL DATA:  Urinary tract infection, bacteremia. EXAM: CT ABDOMEN AND PELVIS WITHOUT CONTRAST TECHNIQUE: Multidetector CT imaging of the abdomen and pelvis was performed following the standard protocol without IV contrast. RADIATION DOSE REDUCTION: This exam was performed according to the departmental dose-optimization program which includes automated exposure control, adjustment of the mA and/or kV according to patient size and/or use of iterative reconstruction technique. COMPARISON:  None Available. FINDINGS: Lower chest: 5 mm nodule is noted in right middle lobe. Hepatobiliary: No focal liver abnormality is seen. No gallstones, gallbladder wall thickening, or biliary dilatation. Pancreas: Unremarkable. No pancreatic ductal dilatation or surrounding inflammatory changes. Spleen: Normal in size without focal abnormality. Adrenals/Urinary Tract: Adrenal glands appear normal. Vascular calcifications are seen involving both kidneys. Right renal cyst is noted for which no further follow-up is required. Mild right perinephric stranding is noted. Pyelonephritis may be present. No hydronephrosis or renal obstruction is noted. Urinary bladder is unremarkable. Stomach/Bowel: Stomach is unremarkable. There is no evidence of bowel obstruction. The appendix appears normal. Stool is noted throughout the colon, with large amount of stool seen in rectum. Vascular/Lymphatic: Aortic atherosclerosis. No enlarged abdominal or pelvic  lymph nodes. Reproductive: Prostatic calcifications are noted. Other: No ascites or hernia is noted. Musculoskeletal: No acute or significant osseous findings. IMPRESSION: 5 mm right middle lobe nodule. If patient is low risk for malignancy, no routine follow-up imaging is recommended. If patient is high risk for malignancy, a non-contrast chest CT at 12 months is optional.This recommendation follows the consensus statement: Guidelines for Management of Incidental Pulmonary Nodules Detected on CT Images: From the Fleischner Society 2017; Radiology 2017; [phone]. No hydronephrosis or renal obstruction is noted. No definite renal or ureteral calculi are noted. Mild right perinephric stranding is noted suggesting possible pyelonephritis. Large amount of stool is noted in the rectum suggesting impaction. Aortic Atherosclerosis (ICD10-I70.0). Electronically Signed   By: Rosalene Colon M.D.   On: 01/18/2024 15:34   DG Chest Port 1 View Result Date: 01/17/2024 CLINICAL DATA:  Questionable sepsis-evaluate for abnormality EXAM: PORTABLE CHEST 1 VIEW COMPARISON:  04/27/2010 FINDINGS: Stable cardiomediastinal silhouette. Aortic atherosclerotic calcification. No focal consolidation, pleural effusion, or pneumothorax. No displaced rib fractures. IMPRESSION: No active disease. Electronically Signed   By: Rozell Cornet M.D.   On: 01/17/2024 22:46    Labs: BNP (last 3 results) No results for input(s): "BNP" in the last 8760 hours. Basic Metabolic Panel: Recent Labs  Lab 01/17/24 2236 01/18/24 0528 01/19/24 0451 01/20/24 0527  NA 131* 133* 135 137  K 3.7 3.9 3.6 3.7  CL 101 103 105 106  CO2 20* 21* 22 24  GLUCOSE 134* 132* 92 98  BUN 43* 40* 36* 36*  CREATININE 2.12* 1.68* 1.42* 1.09  CALCIUM 8.5* 8.2*  8.3* 8.2*  MG 2.2 2.1  --   --   PHOS  --  2.5  --   --    Liver Function Tests: Recent Labs  Lab 01/17/24 2236 01/18/24 0528  AST 19 20  ALT 14 14  ALKPHOS 58 50  BILITOT 0.7 0.7  PROT 8.0  7.1  ALBUMIN 2.9* 2.5*   No results for input(s): "LIPASE", "AMYLASE" in the last 168 hours. No results for input(s): "AMMONIA" in the last 168 hours. CBC: Recent Labs  Lab 01/17/24 2236 01/18/24 0528 01/19/24 0451 01/20/24 0527  WBC 9.2 8.7 7.0 4.3  NEUTROABS 7.2  --   --   --   HGB 11.0* 9.9* 9.5* 9.4*  HCT 32.4* 30.2* 28.6* 28.8*  MCV 87.8 86.3 87.7 87.3  PLT 322 261 272 263  Urinalysis    Component Value Date/Time   COLORURINE YELLOW 01/17/2024 2312   APPEARANCEUR CLOUDY (A) 01/17/2024 2312   LABSPEC 1.013 01/17/2024 2312   PHURINE 5.0 01/17/2024 2312   GLUCOSEU NEGATIVE 01/17/2024 2312   HGBUR MODERATE (A) 01/17/2024 2312   BILIRUBINUR NEGATIVE 01/17/2024 2312   KETONESUR NEGATIVE 01/17/2024 2312   PROTEINUR 100 (A) 01/17/2024 2312   UROBILINOGEN 0.2 04/13/2010 0621   NITRITE NEGATIVE 01/17/2024 2312   LEUKOCYTESUR LARGE (A) 01/17/2024 2312   Sepsis Labs Recent Labs  Lab 01/17/24 2236 01/18/24 0528 01/19/24 0451 01/20/24 0527  WBC 9.2 8.7 7.0 4.3   Microbiology Recent Results (from the past 240 hours)  Resp panel by RT-PCR (RSV, Flu A&B, Covid) Anterior Nasal Swab     Status: None   Collection Time: 01/17/24 10:36 PM   Specimen: Anterior Nasal Swab  Result Value Ref Range Status   SARS Coronavirus 2 by RT PCR NEGATIVE NEGATIVE Final    Comment: (NOTE) SARS-CoV-2 target nucleic acids are NOT DETECTED.  The SARS-CoV-2 RNA is generally detectable in upper respiratory specimens during the acute phase of infection. The lowest concentration of SARS-CoV-2 viral copies this assay can detect is 138 copies/mL. A negative result does not preclude SARS-Cov-2 infection and should not be used as the sole basis for treatment or other patient management decisions. A negative result may occur with  improper specimen collection/handling, submission of specimen other than nasopharyngeal swab, presence of viral mutation(s) within the areas targeted by this assay, and  inadequate number of viral copies(<138 copies/mL). A negative result must be combined with clinical observations, patient history, and epidemiological information. The expected result is Negative.  Fact Sheet for Patients:  BloggerCourse.com  Fact Sheet for Healthcare Providers:  SeriousBroker.it  This test is no t yet approved or cleared by the [place]  FDA and  has been authorized for detection and/or diagnosis of SARS-CoV-2 by FDA under an Emergency Use Authorization (EUA). This EUA will remain  in effect (meaning this test can be used) for the duration of the COVID-19 declaration under Section 564(b)(1) of the Act, 21 U.S.C.section 360bbb-3(b)(1), unless the authorization is terminated  or revoked sooner.       Influenza A by PCR NEGATIVE NEGATIVE Final   Influenza B by PCR NEGATIVE NEGATIVE Final    Comment: (NOTE) The Xpert Xpress SARS-CoV-2/FLU/RSV plus assay is intended as an aid in the diagnosis of influenza from Nasopharyngeal swab specimens and should not be used as a sole basis for treatment. Nasal washings and aspirates are unacceptable for Xpert Xpress SARS-CoV-2/FLU/RSV testing.  Fact Sheet for Patients: BloggerCourse.com  Fact Sheet for Healthcare Providers: SeriousBroker.it  This test is not  yet approved or cleared by the [place]  FDA and has been authorized for detection and/or diagnosis of SARS-CoV-2 by FDA under an Emergency Use Authorization (EUA). This EUA will remain in effect (meaning this test can be used) for the duration of the COVID-19 declaration under Section 564(b)(1) of the Act, 21 U.S.C. section 360bbb-3(b)(1), unless the authorization is terminated or revoked.     Resp Syncytial Virus by PCR NEGATIVE NEGATIVE Final    Comment: (NOTE) Fact Sheet for Patients: BloggerCourse.com  Fact Sheet for Healthcare  Providers: SeriousBroker.it  This test is not yet approved or cleared by the [place]  FDA and has been authorized for detection and/or diagnosis of SARS-CoV-2 by FDA under an Emergency Use Authorization (EUA). This EUA will remain in effect (meaning this test can be used) for the duration of the COVID-19 declaration under Section 564(b)(1) of the Act, 21 U.S.C. section 360bbb-3(b)(1), unless the authorization is terminated or revoked.  Performed at [facility], [address] [place], [place] [address]   Blood Culture (routine x 2)     Status: Abnormal   Collection Time: 01/17/24 10:48 PM   Specimen: BLOOD  Result Value Ref Range Status   Specimen Description   Final    BLOOD LEFT ANTECUBITAL Performed at [facility], [address] [place], [place] [address]    Special Requests   Final    BOTTLES DRAWN AEROBIC AND ANAEROBIC Blood Culture adequate volume Performed at [facility], [address] [place], [place] [address]    Culture  Setup Time   Final    GRAM NEGATIVE RODS IN BOTH AEROBIC AND ANAEROBIC BOTTLES Gram Stain Report Called to,Read Back By and Verified With: M. KATES LAND RN 01/18/24 @1014  BY J. WHITE GRAM STAIN REVIEWED-AGREE WITH RESULT DRT CRITICAL RESULT CALLED TO, READ BACK BY AND VERIFIED WITH: RN STEVEN HURTH ON 01/18/24 @ 1539 BY DRT Performed at [facility], [address], [place], [place] [address]    Culture ESCHERICHIA COLI (A)  Final   Report Status 01/20/2024 FINAL  Final   Organism ID, Bacteria ESCHERICHIA COLI  Final   Organism ID, Bacteria ESCHERICHIA COLI  Final      Susceptibility   Escherichia coli - KIRBY BAUER*    CEFAZOLIN INTERMEDIATE Intermediate    Escherichia coli - MIC*    AMPICILLIN 4 SENSITIVE Sensitive     CEFEPIME  <=0.12 SENSITIVE Sensitive     CEFTAZIDIME <=1 SENSITIVE Sensitive     CEFTRIAXONE  <=0.25 SENSITIVE Sensitive     CIPROFLOXACIN <=0.25 SENSITIVE Sensitive     GENTAMICIN <=1  SENSITIVE Sensitive     IMIPENEM <=0.25 SENSITIVE Sensitive     TRIMETH/SULFA <=20 SENSITIVE Sensitive     AMPICILLIN/SULBACTAM <=2 SENSITIVE Sensitive     PIP/TAZO <=4 SENSITIVE Sensitive ug/mL    * ESCHERICHIA COLI    ESCHERICHIA COLI  Blood Culture ID Panel (Reflexed)     Status: Abnormal   Collection Time: 01/17/24 10:48 PM  Result Value Ref Range Status   Enterococcus faecalis NOT DETECTED NOT DETECTED Final   Enterococcus Faecium NOT DETECTED NOT DETECTED Final   Listeria monocytogenes NOT DETECTED NOT DETECTED Final   Staphylococcus species NOT DETECTED NOT DETECTED Final   Staphylococcus aureus (BCID) NOT DETECTED NOT DETECTED Final   Staphylococcus epidermidis NOT DETECTED NOT DETECTED Final   Staphylococcus lugdunensis NOT DETECTED NOT DETECTED Final   Streptococcus species NOT DETECTED NOT DETECTED Final   Streptococcus agalactiae NOT DETECTED NOT DETECTED Final   Streptococcus  pneumoniae NOT DETECTED NOT DETECTED Final   Streptococcus pyogenes NOT DETECTED NOT DETECTED Final   A.calcoaceticus-baumannii NOT DETECTED NOT DETECTED Final   Bacteroides fragilis NOT DETECTED NOT DETECTED Final   Enterobacterales DETECTED (A) NOT DETECTED Final    Comment: Enterobacterales represent a large order of gram negative bacteria, not a single organism. CRITICAL RESULT CALLED TO, READ BACK BY AND VERIFIED WITH: RN STEVEN HURTH ON 01/18/24 @ 1539 BY DRT    Enterobacter cloacae complex NOT DETECTED NOT DETECTED Final   Escherichia coli DETECTED (A) NOT DETECTED Final    Comment: CRITICAL RESULT CALLED TO, READ BACK BY AND VERIFIED WITH: RN STEVEN HURTH ON 01/18/24 @ 1539 BY DRT    Klebsiella aerogenes NOT DETECTED NOT DETECTED Final   Klebsiella oxytoca NOT DETECTED NOT DETECTED Final   Klebsiella pneumoniae NOT DETECTED NOT DETECTED Final   Proteus species NOT DETECTED NOT DETECTED Final   Salmonella species NOT DETECTED NOT DETECTED Final   Serratia marcescens NOT DETECTED NOT DETECTED  Final   Haemophilus influenzae NOT DETECTED NOT DETECTED Final   Neisseria meningitidis NOT DETECTED NOT DETECTED Final   Pseudomonas aeruginosa NOT DETECTED NOT DETECTED Final   Stenotrophomonas maltophilia NOT DETECTED NOT DETECTED Final   Candida albicans NOT DETECTED NOT DETECTED Final   Candida auris NOT DETECTED NOT DETECTED Final   Candida glabrata NOT DETECTED NOT DETECTED Final   Candida krusei NOT DETECTED NOT DETECTED Final   Candida parapsilosis NOT DETECTED NOT DETECTED Final   Candida tropicalis NOT DETECTED NOT DETECTED Final   Cryptococcus neoformans/gattii NOT DETECTED NOT DETECTED Final   CTX-M ESBL NOT DETECTED NOT DETECTED Final   Carbapenem resistance IMP NOT DETECTED NOT DETECTED Final   Carbapenem resistance KPC NOT DETECTED NOT DETECTED Final   Carbapenem resistance NDM NOT DETECTED NOT DETECTED Final   Carbapenem resist OXA 48 LIKE NOT DETECTED NOT DETECTED Final   Carbapenem resistance VIM NOT DETECTED NOT DETECTED Final    Comment: Performed at [facility], [address], [place], [place] [address]  Blood Culture (routine x 2)     Status: None (Preliminary result)   Collection Time: 01/17/24 10:57 PM   Specimen: BLOOD RIGHT ARM  Result Value Ref Range Status   Specimen Description   Final    BLOOD RIGHT ARM Performed at [facility], [address] [place], [place] [address]    Special Requests   Final    BOTTLES DRAWN AEROBIC AND ANAEROBIC Blood Culture results may not be optimal due to an inadequate volume of blood received in culture bottles   Culture  Setup Time   Final    AEROBIC BOTTLE ONLY GRAM NEGATIVE RODS Gram Stain Report Called to,Read Back By and Verified With: Cyndra Dress @ 1139 ON 01/18/24 C VARNER Performed at [facility], [address] [place], [place] [address]    Culture GRAM NEGATIVE RODS  Final   Report Status PENDING  Incomplete  Urine Culture     Status: Abnormal   Collection Time: 01/17/24 11:12 PM    Specimen: Urine, Random  Result Value Ref Range Status   Specimen Description   Final    URINE, RANDOM Performed at [facility], [address] [place], [place] [address]    Special Requests   Final    NONE Reflexed from [id number] Performed at [facility], [address], [place], [place] [address]    Culture >=100,000 COLONIES/mL ESCHERICHIA COLI (A)  Final   Report Status  01/20/2024 FINAL  Final   Organism ID, Bacteria ESCHERICHIA COLI (A)  Final      Susceptibility   Escherichia coli - MIC*    AMPICILLIN <=2 SENSITIVE Sensitive     CEFAZOLIN <=4 SENSITIVE Sensitive     CEFEPIME  <=0.12 SENSITIVE Sensitive     CEFTRIAXONE  <=0.25 SENSITIVE Sensitive     CIPROFLOXACIN <=0.25 SENSITIVE Sensitive     GENTAMICIN <=1 SENSITIVE Sensitive     IMIPENEM <=0.25 SENSITIVE Sensitive     NITROFURANTOIN <=16 SENSITIVE Sensitive     TRIMETH/SULFA <=20 SENSITIVE Sensitive     AMPICILLIN/SULBACTAM <=2 SENSITIVE Sensitive     PIP/TAZO <=4 SENSITIVE Sensitive ug/mL    * >=100,000 COLONIES/mL ESCHERICHIA COLI  C Difficile Quick Screen w PCR reflex     Status: None   Collection Time: 01/19/24  4:50 AM   Specimen: STOOL  Result Value Ref Range Status   C Diff antigen NEGATIVE NEGATIVE Final   C Diff toxin NEGATIVE NEGATIVE Final   C Diff interpretation No C. difficile detected.  Final    Comment: Performed at [facility], [address] [place], [place] [address]  Gastrointestinal Panel by PCR , Stool     Status: None   Collection Time: 01/19/24  4:50 AM   Specimen: Stool  Result Value Ref Range Status   Campylobacter species NOT DETECTED NOT DETECTED Final   Plesimonas shigelloides NOT DETECTED NOT DETECTED Final   Salmonella species NOT DETECTED NOT DETECTED Final   Yersinia enterocolitica NOT DETECTED NOT DETECTED Final   Vibrio species NOT DETECTED NOT DETECTED Final   Vibrio cholerae NOT DETECTED NOT DETECTED Final   Enteroaggregative E coli (EAEC) NOT DETECTED NOT DETECTED Final    Enteropathogenic E coli (EPEC) NOT DETECTED NOT DETECTED Final   Enterotoxigenic E coli (ETEC) NOT DETECTED NOT DETECTED Final   Shiga like toxin producing E coli (STEC) NOT DETECTED NOT DETECTED Final   Shigella/Enteroinvasive E coli (EIEC) NOT DETECTED NOT DETECTED Final   Cryptosporidium NOT DETECTED NOT DETECTED Final   Cyclospora cayetanensis NOT DETECTED NOT DETECTED Final   Entamoeba histolytica NOT DETECTED NOT DETECTED Final   Giardia lamblia NOT DETECTED NOT DETECTED Final   Adenovirus F40/41 NOT DETECTED NOT DETECTED Final   Astrovirus NOT DETECTED NOT DETECTED Final   Norovirus GI/GII NOT DETECTED NOT DETECTED Final   Rotavirus A NOT DETECTED NOT DETECTED Final   Sapovirus (I, II, IV, and V) NOT DETECTED NOT DETECTED Final    Comment: Performed at [facility], [address] [place], [place] [address]   Time coordinating discharge: 35 minutes  SIGNED: Lesa Rape, MD  [facility] 01/20/2024, 12:51 PM  If 7PM-7AM, please contact night-coverage [email or web address]

## 2024-01-22 LAB — CULTURE, BLOOD (ROUTINE X 2)

## 2024-01-23 ENCOUNTER — Telehealth: Payer: Self-pay

## 2024-01-23 NOTE — Transitions of Care (Post Inpatient/ED Visit) (Signed)
   01/23/2024  Name: Norman Campbell MRN: [id number] DOB: Jun 14, 1951  Today's TOC FU Call Status: Today's TOC FU Call Status:: Unsuccessful Call (1st Attempt) Unsuccessful Call (1st Attempt) Date: 01/23/24  Attempted to reach the patient regarding the most recent Inpatient/ED visit.  Follow Up Plan: Additional outreach attempts will be made to reach the patient to complete the Transitions of Care (Post Inpatient/ED visit) call.   Signature  Seabron Cypress, LPN [facility] Health Advisor [place] [facility] You Are. We Are. One [facility] Direct Dial [phone]

## 2024-01-24 ENCOUNTER — Encounter: Payer: Self-pay | Admitting: *Deleted

## 2024-02-02 ENCOUNTER — Ambulatory Visit: Admitting: Podiatry

## 2024-02-13 ENCOUNTER — Other Ambulatory Visit: Payer: Self-pay | Admitting: Family Medicine

## 2024-04-08 ENCOUNTER — Other Ambulatory Visit: Payer: Self-pay | Admitting: Family Medicine

## 2024-04-21 ENCOUNTER — Other Ambulatory Visit: Payer: Self-pay | Admitting: Family Medicine

## 2024-05-17 ENCOUNTER — Other Ambulatory Visit: Payer: Self-pay | Admitting: Family Medicine

## 2024-05-21 ENCOUNTER — Telehealth: Payer: Self-pay | Admitting: Family Medicine

## 2024-05-21 NOTE — Telephone Encounter (Unsigned)
 Copied from CRM [id number]. Topic: Clinical - Medication Refill >> May 21, 2024 11:07 AM Tinnie C wrote: Medication:  Olmestartan 20mg  Amlodipine  5mg   These meds are not on med list, but were refilled by Dr. Antonetta this year.   Has the patient contacted their pharmacy? Yes (Agent: If no, request that the patient contact the pharmacy for the refill. If patient does not wish to contact the pharmacy document the reason why and proceed with request.) (Agent: If yes, when and what did the pharmacy advise?)  They said he needs to call and make an appt. Appt made for next avail with Zarolo, 09/18/24.  This is the patient's preferred pharmacy:  [facility] [address], [place] - [address] [place] [address] [place] [address] Phone: [phone] Fax: [phone]  Is this the correct pharmacy for this prescription? Yes If no, delete pharmacy and type the correct one.   Has the prescription been filled recently? Yes  Is the patient out of the medication? Yes, for 3 days  Has the patient been seen for an appointment in the last year OR does the patient have an upcoming appointment? Yes  Can we respond through MyChart? No  Agent: Please be advised that Rx refills may take up to 3 business days. We ask that you follow-up with your pharmacy.

## 2024-05-21 NOTE — Telephone Encounter (Signed)
 Please see recent encounter- requested meds are not on med list, but were refilled by Dr. Antonetta this year. Please advise.

## 2024-05-21 NOTE — Telephone Encounter (Signed)
 Patient scheduled for next available- requesting refill to hold him off until then

## 2024-05-22 NOTE — Telephone Encounter (Signed)
 Scheduled with Chelsa Wed morning

## 2024-05-23 ENCOUNTER — Encounter: Payer: Self-pay | Admitting: Nurse Practitioner

## 2024-05-23 ENCOUNTER — Ambulatory Visit (INDEPENDENT_AMBULATORY_CARE_PROVIDER_SITE_OTHER): Admitting: Nurse Practitioner

## 2024-05-23 VITALS — BP 166/90 | HR 68 | Ht 74.0 in | Wt 160.0 lb

## 2024-05-23 DIAGNOSIS — I10 Essential (primary) hypertension: Secondary | ICD-10-CM

## 2024-05-23 DIAGNOSIS — R7303 Prediabetes: Secondary | ICD-10-CM | POA: Diagnosis not present

## 2024-05-23 MED ORDER — OLMESARTAN MEDOXOMIL 20 MG PO TABS: 20.0000 mg | ORAL_TABLET | Freq: Every day | ORAL | 3 refills | Status: DC

## 2024-05-23 MED ORDER — AMLODIPINE BESYLATE 5 MG PO TABS: 5.0000 mg | ORAL_TABLET | Freq: Two times a day (BID) | ORAL | 3 refills | Status: DC

## 2024-05-23 NOTE — Patient Instructions (Signed)
 1) HTN - refills done today 2) Return for fasting labs 3) Follow up appt in 1 month

## 2024-05-23 NOTE — Progress Notes (Signed)
 Established Patient Office Visit  Subjective:  Patient ID: Norman Campbell, male    DOB: 07-16-1951  Age: 73 y.o. MRN: [id number]  Chief Complaint  Patient presents with   Care Management    Follow up    Medication Refill    Refills on amlodipine  and olmesartan     Arm Pain    Upper arm pain with swelling     Patient here today for BP refills.  Patient admits that he eats more starches than he should.  He will return fasting for labs.  Patient denies chest pain, chest tightness, no constipation.  Patient does wear bilateral wrist braces due to lifting and turning his mother when she was alive.  Patient continues to have pain and recommend OTC voltaren gel.  Medication Refill  Arm Pain     No other concerns at this time.   Past Medical History:  Diagnosis Date   Diabetes mellitus without complication (HCC)    Hyperlipidemia    Hypertension     No past surgical history on file.  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 [address]  Tobacco Use   Smoking status: Every Day    Current packs/day: 1.00    Average packs/day: 1 pack/day for 55.0 years (55.0 ttl pk-yrs)    Types: Cigarettes   Smokeless tobacco: Never   Tobacco comments:    Smokes 1/2 pack a day  Vaping Use   Vaping status: Never Used  Substance and Sexual Activity   Alcohol use: Not Currently    Alcohol/week: 0.0 standard drinks of alcohol   Drug use: Never   Sexual activity: Yes  Other Topics Concern   Not on file  Social History Narrative   Not on file   Social Drivers of Health   Financial Resource Strain: Low Risk  (10/11/2023)   Overall Financial Resource Strain (CARDIA)    Difficulty of Paying Living Expenses: Not hard at all  Food Insecurity: No Food  Insecurity (01/18/2024)   Hunger Vital Sign    Worried About Running Out of Food in the Last Year: Never true    Ran Out of Food in the Last Year: Never true  Transportation Needs: No Transportation Needs (01/18/2024)   PRAPARE - Administrator, Civil Service (Medical): No    Lack of Transportation (Non-Medical): No  Physical Activity: Inactive (10/11/2023)   Exercise Vital Sign    Days of Exercise per Week: 0 days    Minutes of Exercise per Session: 0 min  Stress: No Stress Concern Present (10/11/2023)   [employer] of Occupational Health - Occupational Stress Questionnaire    Feeling of Stress : Not at all  Social Connections: Moderately Isolated (01/18/2024)   Social Connection and Isolation Panel    Frequency of Communication with Friends and Family: More than three times a week    Frequency of Social Gatherings with Friends and Family: More than three times a week    Attends Religious Services: Never    Database administrator or Organizations: No    Attends Banker Meetings: Never    Marital Status: Married  Catering manager Violence: Not At Risk (01/18/2024)   Humiliation, Afraid,  Rape, and Kick questionnaire    Fear of Current or Ex-Partner: No    Emotionally Abused: No    Physically Abused: No    Sexually Abused: No    Family History  Family history unknown: Yes    No Known Allergies  Outpatient Medications Prior to Visit  Medication Sig   amLODipine  (NORVASC ) 5 MG tablet Take 5 mg by mouth 2 (two) times daily.   olmesartan  (BENICAR ) 20 MG tablet Take 20 mg by mouth daily.   aspirin 81 MG tablet Take 81 mg by mouth daily.   Chromium Picolinate (CHROMIUM PICOLATE PO) Take 1 tablet by mouth daily.   lovastatin  (MEVACOR ) 40 MG tablet Take 1 tablet (40 mg total) by mouth at bedtime.   meclizine  (ANTIVERT ) 25 MG tablet TAKE 1 TABLET BY MOUTH THREE TIMES DAILY   No facility-administered medications prior to visit.    ROS     Objective:   BP  (!) 166/90   Pulse 68   Ht 6' 2 (1.88 m)   Wt 160 lb (72.6 kg)   SpO2 98%   BMI 20.54 kg/m   Vitals:   05/23/24 0843  BP: (!) 166/90  Pulse: 68  Height: 6' 2 (1.88 m)  Weight: 160 lb (72.6 kg)  SpO2: 98%  BMI (Calculated): 20.53    Physical Exam Vitals and nursing note reviewed.  Constitutional:      Appearance: Normal appearance.  HENT:     Head: Normocephalic.     Nose: Nose normal.     Mouth/Throat:     Mouth: Mucous membranes are moist.  Cardiovascular:     Rate and Rhythm: Normal rate and regular rhythm.     Pulses: Normal pulses.     Heart sounds: Normal heart sounds.  Pulmonary:     Effort: Pulmonary effort is normal.     Breath sounds: Normal breath sounds.  Musculoskeletal:        General: Tenderness present.     Cervical back: Normal range of motion and neck supple.  Skin:    General: Skin is warm and dry.  Neurological:     Mental Status: He is alert and oriented to person, place, and time.  Psychiatric:        Mood and Affect: Mood normal.        Behavior: Behavior normal.      No results found for any visits on 05/23/24.  No results found for this or any previous visit (from the past 2160 hours).    Assessment & Plan: 1) HTN - refills done today 2) Return for fasting labs 3) Follow up appt in 1 month   Problem List Items Addressed This Visit   None   No follow-ups on file.   Total time spent: 20 minutes  Neale Carpen, NP  05/23/2024   This document may have been prepared by [facility] Voice Recognition software and as such may include unintentional dictation errors.

## 2024-06-19 ENCOUNTER — Other Ambulatory Visit: Payer: Self-pay | Admitting: Family Medicine

## 2024-06-20 NOTE — Progress Notes (Signed)
 Norman Campbell                                          MRN: [id number]   06/20/2024   The [facility] Quality Team Specialist reviewed this patient medical record for the purposes of chart review for care gap closure. The following were reviewed: chart review for care gap closure-glycemic status assessment.    VBCI Quality Team

## 2024-08-04 ENCOUNTER — Other Ambulatory Visit: Payer: Self-pay | Admitting: Family Medicine

## 2024-09-04 NOTE — Progress Notes (Signed)
 "  Subjective:    Patient ID: Norman Campbell, male    DOB: 1950/11/21, 73 y.o.   MRN: [id number]    Chief Complaint: No chief complaint on file.    HPI:  Norman Campbell is a 73 y.o. who identifies as a male who was assigned male at birth.   Social history: Lives with: wife and son Work history: retired   Water Engineer in today for follow up of the following chronic medical issues:  1. Essential hypertension No c/o chest pain, sob or headache. Does check blood pressure at home. Runs high most of the time. BP Readings from Last 3 Encounters:  05/23/24 (!) 166/90  01/20/24 (!) 111/55  10/04/22 (!) 155/77      2. Mixed hyperlipidemia Suppose to be on mevacor  but has not had meds filled. Last lipid panel was in 2021 as indicted below Lab Results  Component Value Date   CHOL 209 (H) 01/05/2021   HDL 40 01/05/2021   LDLCALC 151 (H) 01/05/2021   TRIG 102 01/05/2021      3. Hyponatremia Will recheck today Last sodium level was 137.   4. Prediabetes Blood sugars at ome are running in 90's. He tries to watch his diet Lab Results  Component Value Date   HGBA1C 6.2 (H) 01/05/2021     5. Cigarette nicotine dependence without complication Smoke about 1/4 pack a day. He is slowly cutting back. Denies SOB or cough. Has not had low dose CT scan. Was ordered in January  but did not have done.   6. Lung nodule Last chest xray was done on 01/17/24 and was all clear. No abnormal findings noted.    New complaints: Bil wrist pain for over a year. Wear wrist braces most of the time.Pain changes in intensity. Rates pain 4/10 currently. When he tries to pick up something will shoot up to 8-9/10.  Allergies[1] Outpatient Encounter Medications as of 09/05/2024  Medication Sig   amLODipine  (NORVASC ) 5 MG tablet Take 1 tablet (5 mg total) by mouth 2 (two) times daily.   aspirin 81 MG tablet Take 81 mg by mouth daily.   Chromium Picolinate (CHROMIUM PICOLATE PO) Take 1 tablet by  mouth daily.   lovastatin  (MEVACOR ) 40 MG tablet Take 1 tablet (40 mg total) by mouth at bedtime.   meclizine  (ANTIVERT ) 25 MG tablet TAKE 1 TABLET BY MOUTH THREE TIMES DAILY   olmesartan  (BENICAR ) 20 MG tablet Take 1 tablet (20 mg total) by mouth daily.   No facility-administered encounter medications on file as of 09/05/2024.    No past surgical history on file.  Family History  Family history unknown: Yes      Controlled substance contract: n/a    Review of Systems  Constitutional:  Negative for diaphoresis.  Eyes:  Negative for pain.  Respiratory:  Negative for shortness of breath.   Cardiovascular:  Negative for chest pain, palpitations and leg swelling.  Gastrointestinal:  Negative for abdominal pain.  Endocrine: Negative for polydipsia.  Musculoskeletal:  Positive for arthralgias (bil wrist).  Skin:  Negative for rash.  Neurological:  Negative for dizziness, weakness and headaches.  Hematological:  Does not bruise/bleed easily.  All other systems reviewed and are negative.      Objective:   Physical Exam Vitals and nursing note reviewed.  Constitutional:      Appearance: Normal appearance. He is well-developed.  HENT:     Head: Normocephalic.     Nose: Nose normal.  Mouth/Throat:     Mouth: Mucous membranes are moist.     Pharynx: Oropharynx is clear.  Eyes:     Pupils: Pupils are equal, round, and reactive to light.  Neck:     Thyroid : No thyroid  mass or thyromegaly.     Vascular: No carotid bruit or JVD.     Trachea: Phonation normal.  Cardiovascular:     Rate and Rhythm: Normal rate and regular rhythm.  Pulmonary:     Effort: Pulmonary effort is normal. No respiratory distress.     Breath sounds: Normal breath sounds.  Abdominal:     General: Bowel sounds are normal.     Palpations: Abdomen is soft.     Tenderness: There is no abdominal tenderness.  Musculoskeletal:        General: Normal range of motion.     Cervical back: Normal range of  motion and neck supple.     Comments: Nodules noted on left wrist with inability to move wrist side to side Right wrist mild edema with slightly more movement then left Grip weaker on left then right.  Lymphadenopathy:     Cervical: No cervical adenopathy.  Skin:    General: Skin is warm and dry.  Neurological:     Mental Status: He is alert and oriented to person, place, and time.  Psychiatric:        Behavior: Behavior normal.        Thought Content: Thought content normal.        Judgment: Judgment normal.      BP (!) 190/89   Pulse 93   Ht 6' 2 (1.88 m)   Wt 185 lb (83.9 kg)   BMI 23.75 kg/m       Assessment & Plan:   Norman Campbell comes in today with chief complaint of Medical Management of Chronic Issues (Medication management )   Diagnosis and orders addressed:  1. Essential hypertension (Primary) Increased olmesartan  to 40mg  daily Continue norvasc  at current dose for now. Keep diary of blood pressure daily - olmesartan  (BENICAR ) 40 MG tablet; Take 1 tablet (40 mg total) by mouth daily.  Dispense: 90 tablet; Refill: 1  2. Mixed hyperlipidemia Low fat diet - atorvastatin  (LIPITOR) 40 MG tablet; Take 1 tablet (40 mg total) by mouth daily.  Dispense: 90 tablet; Refill: 1  3. Hyponatremia Labs pending  4. Prediabetes Labs pending  5. Cigarette nicotine dependence without complication Encouraged smoking cessation Refuses low dose CT scan  6. Lung nodule Will continue to encourage smoking cessation  7. Hyperlipidemia, unspecified hyperlipidemia type Change mevacor  to lipitor 40mg  dialy Labs pending  8. Pain in both wrists Start on mobic  Referral to ortho Continue to wear wrist braces - Ambulatory referral to Orthopedic Surgery - meloxicam  (MOBIC ) 15 MG tablet; Take 1 tablet (15 mg total) by mouth daily.  Dispense: 30 tablet; Refill: 0   Labs pending Health Maintenance reviewed Diet and exercise encouraged  Follow up plan: 2  weeks   Mary-Margaret Gladis, FNP     [1] No Known Allergies  "

## 2024-09-05 ENCOUNTER — Encounter: Payer: Self-pay | Admitting: Nurse Practitioner

## 2024-09-05 ENCOUNTER — Ambulatory Visit: Admitting: Nurse Practitioner

## 2024-09-05 VITALS — BP 190/89 | HR 93 | Ht 74.0 in | Wt 185.0 lb

## 2024-09-05 DIAGNOSIS — F17210 Nicotine dependence, cigarettes, uncomplicated: Secondary | ICD-10-CM

## 2024-09-05 DIAGNOSIS — E782 Mixed hyperlipidemia: Secondary | ICD-10-CM

## 2024-09-05 DIAGNOSIS — M25531 Pain in right wrist: Secondary | ICD-10-CM

## 2024-09-05 DIAGNOSIS — R7303 Prediabetes: Secondary | ICD-10-CM

## 2024-09-05 DIAGNOSIS — E871 Hypo-osmolality and hyponatremia: Secondary | ICD-10-CM

## 2024-09-05 DIAGNOSIS — M25532 Pain in left wrist: Secondary | ICD-10-CM

## 2024-09-05 DIAGNOSIS — I10 Essential (primary) hypertension: Secondary | ICD-10-CM

## 2024-09-05 DIAGNOSIS — E785 Hyperlipidemia, unspecified: Secondary | ICD-10-CM

## 2024-09-05 DIAGNOSIS — R911 Solitary pulmonary nodule: Secondary | ICD-10-CM

## 2024-09-05 MED ORDER — OLMESARTAN MEDOXOMIL 40 MG PO TABS: 40.0000 mg | ORAL_TABLET | Freq: Every day | ORAL | 1 refills | Status: AC

## 2024-09-05 MED ORDER — ATORVASTATIN CALCIUM 40 MG PO TABS: 40.0000 mg | ORAL_TABLET | Freq: Every day | ORAL | 1 refills | Status: AC

## 2024-09-05 MED ORDER — MELOXICAM 15 MG PO TABS: 15.0000 mg | ORAL_TABLET | Freq: Every day | ORAL | 0 refills | Status: AC

## 2024-09-05 NOTE — Patient Instructions (Signed)

## 2024-09-17 ENCOUNTER — Encounter: Payer: Self-pay | Admitting: Orthopedic Surgery

## 2024-09-17 ENCOUNTER — Other Ambulatory Visit: Payer: Self-pay

## 2024-09-17 ENCOUNTER — Ambulatory Visit: Admitting: Orthopedic Surgery

## 2024-09-17 ENCOUNTER — Other Ambulatory Visit (INDEPENDENT_AMBULATORY_CARE_PROVIDER_SITE_OTHER)

## 2024-09-17 VITALS — BP 222/111 | HR 86 | Ht 74.0 in | Wt 185.0 lb

## 2024-09-17 DIAGNOSIS — M65932 Unspecified synovitis and tenosynovitis, left forearm: Secondary | ICD-10-CM

## 2024-09-17 DIAGNOSIS — M65931 Unspecified synovitis and tenosynovitis, right forearm: Secondary | ICD-10-CM

## 2024-09-17 NOTE — Patient Instructions (Signed)
 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

## 2024-09-17 NOTE — Progress Notes (Signed)
" ° °  Chief Complaint  Patient presents with   Wrist Problem    Both wrist painful R WORSE    This is a 74 year old male who began having wrist problems back in 2022.  It looks like someone saw him did x-rays and had an MRI which showed an inflammatory arthritis of the wrist  However, he presents now with bilateral wrist pain and stiffness and he would like to know what is going on with his wrist  He does take an anti-inflammatory and wear wrist braces  He did note some increased pain when he had to take care of his mother     Past Medical History:  Diagnosis Date   Diabetes mellitus without complication (HCC)    Hyperlipidemia    Hypertension     PHYSICAL EXAM:   General appearance seems normal he is thin well-developed well-nourished grooming hygiene are normal  BP (!) 222/111 Comment: patient will let primary care know  Pulse 86   Ht 6' 2 (1.88 m)   Wt 185 lb (83.9 kg)   BMI 23.75 kg/m   He is oriented x 3  He walks without a limp  Both wrists are stiff he has decreased range of motion in flexion and extension as well as rotation he has tenderness over the dorsum of the wrist and there is deformity of the left dorsal wrist.  There does not appear to be instability he can make a fist but his grip strength seems weak the skin is intact the color capillary refill and pulses are normal and there are no sensory deficits  Assessment and Plan:   Encounter Diagnoses  Name Primary?   Synovitis of left wrist    Synovitis of right wrist Yes   DG Wrist Complete Left Result Date: 09/17/2024 Painful left wrist X-ray shows severe erosive changes of the entire carpus radiocarpal joint ulnocarpal joint Impression probable inflammatory arthritis of the wrist with complete obliteration of the radiocarpal joint and ulnocarpal joint and intercarpal joints   DG Wrist Complete Right Result Date: 09/17/2024 Pain right wrist chronic over 3 years X-rays show radial carpal arthritis with  ground glass appearance of the bones in the carpus and degenerative changes throughout including the carpal metacarpal joints.  Severe narrowing of the wrist joint. Severe OA or RA or inflammatory arthritis     The patient does not weeks to seek surgical intervention at this time so we gave him some new braces and told him to continue to take his medication.  If he decides to have surgical intervention then we can refer him to the hand specialist  "

## 2024-09-18 ENCOUNTER — Ambulatory Visit: Payer: Self-pay | Admitting: Family Medicine

## 2024-09-19 ENCOUNTER — Ambulatory Visit (INDEPENDENT_AMBULATORY_CARE_PROVIDER_SITE_OTHER): Payer: Self-pay | Admitting: Nurse Practitioner

## 2024-09-19 ENCOUNTER — Encounter: Payer: Self-pay | Admitting: Nurse Practitioner

## 2024-09-19 VITALS — BP 158/76 | HR 90 | Ht 74.0 in | Wt 188.0 lb

## 2024-09-19 DIAGNOSIS — I10 Essential (primary) hypertension: Secondary | ICD-10-CM

## 2024-09-19 DIAGNOSIS — Z125 Encounter for screening for malignant neoplasm of prostate: Secondary | ICD-10-CM

## 2024-09-19 DIAGNOSIS — R7303 Prediabetes: Secondary | ICD-10-CM

## 2024-09-19 MED ORDER — AMLODIPINE BESYLATE 10 MG PO TABS: 10.0000 mg | ORAL_TABLET | Freq: Every day | ORAL | 1 refills | Status: AC

## 2024-09-19 NOTE — Patient Instructions (Signed)

## 2024-09-19 NOTE — Progress Notes (Signed)
" ° °  Subjective:    Patient ID: Norman Campbell, male    DOB: 1951-06-21, 74 y.o.   MRN: [id number]  Chief Complaint: hypertension  HPI  Patient was seen on 09/05/24 for chronic follow up. His blood pressure was elevated at the time. We increased his olemesartan to 40mg  daily. Since medication change his has not been able to check blood pressure at home. Machine is not working. He says he feels better since changing meds.  Was suppose to have labs drawn on 05/23/24- did not do. Will do today. Patient Active Problem List   Diagnosis Date Noted   Lung nodule 01/19/2024   Hyponatremia 01/18/2024   Hypoalbuminemia due to protein-calorie malnutrition 01/18/2024   Chronic hand pain, right 08/11/2022   Unintentional weight loss 09/15/2021   Nicotine addiction 07/29/2021   Mixed hyperlipidemia 01/05/2021   Prediabetes 12/22/2020   Essential hypertension 12/22/2020   Vertigo 12/22/2020       Review of Systems  Constitutional:  Negative for diaphoresis.  Eyes:  Negative for pain.  Respiratory:  Negative for shortness of breath.   Cardiovascular:  Negative for chest pain, palpitations and leg swelling.  Gastrointestinal:  Negative for abdominal pain.  Endocrine: Negative for polydipsia.  Skin:  Negative for rash.  Neurological:  Negative for dizziness, weakness and headaches.  Hematological:  Does not bruise/bleed easily.  All other systems reviewed and are negative.      Objective:   Physical Exam Constitutional:      Appearance: Normal appearance.  Cardiovascular:     Rate and Rhythm: Normal rate and regular rhythm.     Heart sounds: Normal heart sounds.  Pulmonary:     Effort: Pulmonary effort is normal.     Breath sounds: Normal breath sounds.  Skin:    General: Skin is warm.  Neurological:     General: No focal deficit present.     Mental Status: He is alert and oriented to person, place, and time.  Psychiatric:        Mood and Affect: Mood normal.        Behavior:  Behavior normal.       BP (!) 189/89   Pulse 90   Ht 6' 2 (1.88 m)   Wt 188 lb (85.3 kg)   SpO2 98%   BMI 24.14 kg/m      Assessment & Plan:   Norman Campbell in today with chief complaint of Hypertension (Follow up )   1. Essential hypertension (Primary) Continue benicar  daily as prescribed Increased amlodipine  to 10mg  daily Please keep diary of blood pressure at home - amLODipine  (NORVASC ) 10 MG tablet; Take 1 tablet (10 mg total) by mouth daily.  Dispense: 90 tablet; Refill: 1    The above assessment and management plan was discussed with the patient. The patient verbalized understanding of and has agreed to the management plan. Patient is aware to call the clinic if symptoms persist or worsen. Patient is aware when to return to the clinic for a follow-up visit. Patient educated on when it is appropriate to go to the emergency department.   Mary-Margaret Gladis, FNP   "

## 2024-09-19 NOTE — Addendum Note (Signed)
 Addended by: Daltyn Degroat, MARY-MARGARET on: 09/19/2024 10:11 AM   Modules accepted: Orders

## 2024-09-20 ENCOUNTER — Ambulatory Visit: Payer: Self-pay | Admitting: Nurse Practitioner

## 2024-09-20 LAB — LIPID PANEL
Chol/HDL Ratio: 2.5 ratio (ref 0.0–5.0)
Cholesterol, Total: 168 mg/dL (ref 100–199)
HDL: 66 mg/dL
LDL Chol Calc (NIH): 88 mg/dL (ref 0–99)
Triglycerides: 71 mg/dL (ref 0–149)
VLDL Cholesterol Cal: 14 mg/dL (ref 5–40)

## 2024-09-20 LAB — CBC WITH DIFFERENTIAL/PLATELET
Basophils Absolute: 0 x10E3/uL (ref 0.0–0.2)
Basos: 1 %
EOS (ABSOLUTE): 0.3 x10E3/uL (ref 0.0–0.4)
Eos: 5 %
Hematocrit: 41.4 % (ref 37.5–51.0)
Hemoglobin: 13.9 g/dL (ref 13.0–17.7)
Immature Grans (Abs): 0 x10E3/uL (ref 0.0–0.1)
Immature Granulocytes: 0 %
Lymphocytes Absolute: 2.1 x10E3/uL (ref 0.7–3.1)
Lymphs: 37 %
MCH: 31.3 pg (ref 26.6–33.0)
MCHC: 33.6 g/dL (ref 31.5–35.7)
MCV: 93 fL (ref 79–97)
Monocytes Absolute: 0.4 x10E3/uL (ref 0.1–0.9)
Monocytes: 7 %
Neutrophils Absolute: 2.8 x10E3/uL (ref 1.4–7.0)
Neutrophils: 50 %
Platelets: 278 x10E3/uL (ref 150–450)
RBC: 4.44 x10E6/uL (ref 4.14–5.80)
RDW: 13 % (ref 11.6–15.4)
WBC: 5.6 x10E3/uL (ref 3.4–10.8)

## 2024-09-20 LAB — CMP14+EGFR
ALT: 70 IU/L — ABNORMAL HIGH (ref 0–44)
AST: 42 IU/L — ABNORMAL HIGH (ref 0–40)
Albumin: 4.2 g/dL (ref 3.8–4.8)
Alkaline Phosphatase: 86 IU/L (ref 47–123)
BUN/Creatinine Ratio: 16 (ref 10–24)
BUN: 20 mg/dL (ref 8–27)
Bilirubin Total: 0.5 mg/dL (ref 0.0–1.2)
CO2: 24 mmol/L (ref 20–29)
Calcium: 9.5 mg/dL (ref 8.6–10.2)
Chloride: 105 mmol/L (ref 96–106)
Creatinine, Ser: 1.23 mg/dL (ref 0.76–1.27)
Globulin, Total: 3.9 g/dL (ref 1.5–4.5)
Glucose: 127 mg/dL — ABNORMAL HIGH (ref 70–99)
Potassium: 4.7 mmol/L (ref 3.5–5.2)
Sodium: 142 mmol/L (ref 134–144)
Total Protein: 8.1 g/dL (ref 6.0–8.5)
eGFR: 63 mL/min/1.73

## 2024-09-20 LAB — PSA, TOTAL AND FREE
PSA, Free Pct: 27.9 %
PSA, Free: 0.39 ng/mL
Prostate Specific Ag, Serum: 1.4 ng/mL (ref 0.0–4.0)

## 2024-09-25 LAB — BAYER DCA HB A1C WAIVED: HB A1C (BAYER DCA - WAIVED): 5 % (ref 4.8–5.6)

## 2024-09-27 NOTE — Progress Notes (Signed)
 Norman Campbell                                          MRN: [id number]   09/27/2024   The [facility] Quality Team Specialist reviewed this patient medical record for the purposes of chart review for care gap closure. The following were reviewed: chart review for care gap closure-kidney health evaluation for diabetes:eGFR  and uACR.    [facility] Quality Team

## 2024-10-05 ENCOUNTER — Ambulatory Visit: Payer: Self-pay | Admitting: Internal Medicine

## 2024-10-05 ENCOUNTER — Other Ambulatory Visit: Payer: Self-pay | Admitting: Nurse Practitioner

## 2024-10-05 DIAGNOSIS — M25531 Pain in right wrist: Secondary | ICD-10-CM

## 2024-10-12 ENCOUNTER — Other Ambulatory Visit: Payer: Self-pay | Admitting: Family Medicine

## 2024-10-15 ENCOUNTER — Ambulatory Visit: Payer: Medicare HMO

## 2024-10-16 NOTE — Progress Notes (Unsigned)
" ° °  Subjective:    Patient ID: Norman Campbell, male    DOB: 12-Jul-1951, 74 y.o.   MRN: [id number]   Chief Complaint: blood pressure recheck  HPI  Hypertension Patient was seen on 09/19/24 with uncontrolled blood pressure. We increased his olmesartan  to 40mg  a day and he was to continue his norvasc . Blood pressure at home has been running *** Patient Active Problem List   Diagnosis Date Noted   Lung nodule 01/19/2024   Hyponatremia 01/18/2024   Hypoalbuminemia due to protein-calorie malnutrition 01/18/2024   Chronic hand pain, right 08/11/2022   Unintentional weight loss 09/15/2021   Nicotine addiction 07/29/2021   Mixed hyperlipidemia 01/05/2021   Prediabetes 12/22/2020   Essential hypertension 12/22/2020   Vertigo 12/22/2020       Review of Systems     Objective:   Physical Exam        Assessment & Plan:    "

## 2024-10-17 ENCOUNTER — Ambulatory Visit: Payer: Self-pay | Admitting: Nurse Practitioner

## 2024-10-17 DIAGNOSIS — I10 Essential (primary) hypertension: Secondary | ICD-10-CM

## 2024-10-19 ENCOUNTER — Ambulatory Visit
